# Patient Record
Sex: Female | Born: 1960
Health system: Southern US, Community
[De-identification: ages and names within clinical notes are randomized; demographics above are authoritative.]

## PROBLEM LIST (undated history)

## (undated) DIAGNOSIS — D497 Neoplasm of unspecified behavior of endocrine glands and other parts of nervous system: Secondary | ICD-10-CM

## (undated) DIAGNOSIS — R112 Nausea with vomiting, unspecified: Secondary | ICD-10-CM

## (undated) DIAGNOSIS — D219 Benign neoplasm of connective and other soft tissue, unspecified: Secondary | ICD-10-CM

## (undated) DIAGNOSIS — Z9889 Other specified postprocedural states: Secondary | ICD-10-CM

## (undated) DIAGNOSIS — B019 Varicella without complication: Secondary | ICD-10-CM

## (undated) HISTORY — DX: Varicella without complication: B01.9

## (undated) HISTORY — DX: Benign neoplasm of connective and other soft tissue, unspecified: D21.9

## (undated) HISTORY — PX: TONSILLECTOMY: SUR1361

## (undated) HISTORY — PX: AUGMENTATION MAMMAPLASTY: SUR837

## (undated) HISTORY — PX: TOTAL VAGINAL HYSTERECTOMY: SHX2548

## (undated) HISTORY — PX: BREAST ENHANCEMENT SURGERY: SHX7

## (undated) HISTORY — PX: FOOT SURGERY: SHX648

## (undated) HISTORY — PX: ABLATION: SHX5711

## (undated) HISTORY — PX: ABDOMINAL HYSTERECTOMY: SHX81

## (undated) HISTORY — PX: BREAST SURGERY: SHX581

---

## 2015-09-17 DIAGNOSIS — H6063 Unspecified chronic otitis externa, bilateral: Secondary | ICD-10-CM | POA: Insufficient documentation

## 2017-03-28 DIAGNOSIS — Z713 Dietary counseling and surveillance: Secondary | ICD-10-CM | POA: Diagnosis not present

## 2017-04-10 DIAGNOSIS — Z713 Dietary counseling and surveillance: Secondary | ICD-10-CM | POA: Diagnosis not present

## 2017-04-24 DIAGNOSIS — Z713 Dietary counseling and surveillance: Secondary | ICD-10-CM | POA: Diagnosis not present

## 2017-05-08 DIAGNOSIS — Z713 Dietary counseling and surveillance: Secondary | ICD-10-CM | POA: Diagnosis not present

## 2017-05-22 DIAGNOSIS — Z713 Dietary counseling and surveillance: Secondary | ICD-10-CM | POA: Diagnosis not present

## 2017-06-05 DIAGNOSIS — Z713 Dietary counseling and surveillance: Secondary | ICD-10-CM | POA: Diagnosis not present

## 2017-06-18 ENCOUNTER — Ambulatory Visit (INDEPENDENT_AMBULATORY_CARE_PROVIDER_SITE_OTHER): Payer: BLUE CROSS/BLUE SHIELD | Admitting: Internal Medicine

## 2017-06-18 VITALS — BP 106/60 | HR 60 | Temp 98.1°F | Ht 66.0 in | Wt 164.2 lb

## 2017-06-18 DIAGNOSIS — Z0000 Encounter for general adult medical examination without abnormal findings: Secondary | ICD-10-CM | POA: Diagnosis not present

## 2017-06-18 DIAGNOSIS — Z1159 Encounter for screening for other viral diseases: Secondary | ICD-10-CM

## 2017-06-18 DIAGNOSIS — Z1389 Encounter for screening for other disorder: Secondary | ICD-10-CM

## 2017-06-18 DIAGNOSIS — Z13220 Encounter for screening for lipoid disorders: Secondary | ICD-10-CM | POA: Diagnosis not present

## 2017-06-18 DIAGNOSIS — L739 Follicular disorder, unspecified: Secondary | ICD-10-CM | POA: Diagnosis not present

## 2017-06-18 DIAGNOSIS — D1779 Benign lipomatous neoplasm of other sites: Secondary | ICD-10-CM

## 2017-06-18 DIAGNOSIS — H01119 Allergic dermatitis of unspecified eye, unspecified eyelid: Secondary | ICD-10-CM

## 2017-06-18 DIAGNOSIS — L219 Seborrheic dermatitis, unspecified: Secondary | ICD-10-CM

## 2017-06-18 DIAGNOSIS — E559 Vitamin D deficiency, unspecified: Secondary | ICD-10-CM | POA: Diagnosis not present

## 2017-06-18 DIAGNOSIS — R200 Anesthesia of skin: Secondary | ICD-10-CM

## 2017-06-18 DIAGNOSIS — D1720 Benign lipomatous neoplasm of skin and subcutaneous tissue of unspecified limb: Secondary | ICD-10-CM

## 2017-06-18 DIAGNOSIS — E049 Nontoxic goiter, unspecified: Secondary | ICD-10-CM | POA: Diagnosis not present

## 2017-06-18 DIAGNOSIS — R0982 Postnasal drip: Secondary | ICD-10-CM | POA: Diagnosis not present

## 2017-06-18 DIAGNOSIS — R208 Other disturbances of skin sensation: Secondary | ICD-10-CM | POA: Diagnosis not present

## 2017-06-18 MED ORDER — FLUOCINOLONE ACETONIDE 0.01 % EX SOLN: Freq: Two times a day (BID) | CUTANEOUS | 0 refills | Status: DC

## 2017-06-18 MED ORDER — HYDROCORTISONE BUTYR LIPO BASE 0.1 % EX CREA: 1.0000 | TOPICAL_CREAM | Freq: Two times a day (BID) | CUTANEOUS | 0 refills | Status: DC

## 2017-06-18 MED ORDER — DOXYCYCLINE HYCLATE 100 MG PO TABS: 100.0000 mg | ORAL_TABLET | Freq: Two times a day (BID) | ORAL | 0 refills | Status: DC

## 2017-06-18 NOTE — Progress Notes (Signed)
Pre visit review using our clinic review tool, if applicable. No additional management support is needed unless otherwise documented below in the visit note. 

## 2017-06-18 NOTE — Patient Instructions (Addendum)
Please add Nasal Saline then Flonase  Try Claritin or Zyrtec at night  Try Nizoral Shampoo over the counter  We will sch thyroid US and right upper extremity ultrasound  Use locoid to face  Use Fluocinolone to ears with nizoral shampoo  Labs in 1-2 weeks, physical in 1 month  Atopic Dermatitis Atopic dermatitis is a skin disorder that causes inflammation of the skin. This is the most common type of eczema. Eczema is a group of skin conditions that cause the skin to be itchy, red, and swollen. This condition is generally worse during the cooler winter months and often improves during the warm summer months. Symptoms can vary from person to person. Atopic dermatitis usually starts showing signs in infancy and can last through adulthood. This condition cannot be passed from one person to another (non-contagious), but it is more common in families. Atopic dermatitis may not always be present. When it is present, it is called a flare-up. What are the causes? The exact cause of this condition is not known. Flare-ups of the condition may be triggered by:  Contact with something that you are sensitive or allergic to.  Stress.  Certain foods.  Extremely hot or cold weather.  Harsh chemicals and soaps.  Dry air.  Chlorine.  What increases the risk? This condition is more likely to develop in people who have a personal history or family history of eczema, allergies, asthma, or hay fever. What are the signs or symptoms? Symptoms of this condition include:  Dry, scaly skin.  Red, itchy rash.  Itchiness, which can be severe. This may occur before the skin rash. This can make sleeping difficult.  Skin thickening and cracking that can occur over time.  How is this diagnosed? This condition is diagnosed based on your symptoms, a medical history, and a physical exam. How is this treated? There is no cure for this condition, but symptoms can usually be controlled. Treatment focuses  on:  Controlling the itchiness and scratching. You may be given medicines, such as antihistamines or steroid creams.  Limiting exposure to things that you are sensitive or allergic to (allergens).  Recognizing situations that cause stress and developing a plan to manage stress.  If your atopic dermatitis does not get better with medicines, or if it is all over your body (widespread), a treatment using a specific type of light (phototherapy) may be used. Follow these instructions at home: Skin care  Keep your skin well-moisturized. Doing this seals in moisture and helps to prevent dryness. ? Use unscented lotions that have petroleum in them. ? Avoid lotions that contain alcohol or water. They can dry the skin.  Keep baths or showers short (less than 5 minutes) in warm water. Do not use hot water. ? Use mild, unscented cleansers for bathing. Avoid soap and bubble bath. ? Apply a moisturizer to your skin right after a bath or shower.  Do not apply anything to your skin without checking with your health care provider. General instructions  Dress in clothes made of cotton or cotton blends. Dress lightly because heat increases itchiness.  When washing your clothes, rinse your clothes twice so all of the soap is removed.  Avoid any triggers that can cause a flare-up.  Try to manage your stress.  Keep your fingernails cut short.  Avoid scratching. Scratching makes the rash and itchiness worse. It may also result in a skin infection (impetigo) due to a break in the skin caused by scratching.  Take or apply  over-the-counter and prescription medicines only as told by your health care provider.  Keep all follow-up visits as told by your health care provider. This is important.  Do not be around people who have cold sores or fever blisters. If you get the infection, it may cause your atopic dermatitis to worsen. Contact a health care provider if:  Your itchiness interferes with  sleep.  Your rash gets worse or it is not better within one week of starting treatment.  You have a fever.  You have a rash flare-up after having contact with someone who has cold sores or fever blisters. Get help right away if:  You develop pus or soft yellow scabs in the rash area. Summary  This condition causes a red rash and itchy, dry, scaly skin.  Treatment focuses on controlling the itchiness and scratching, limiting exposure to things that you are sensitive or allergic to (allergens), recognizing situations that cause stress, and developing a plan to manage stress.  Keep your skin well-moisturized.  Keep baths or showers shorter than 5 minutes and use warm water. Do not use hot water. This information is not intended to replace advice given to you by your health care provider. Make sure you discuss any questions you have with your health care provider. Document Released: 03/02/2000 Document Revised: 04/06/2016 Document Reviewed: 04/06/2016 Elsevier Interactive Patient Education  2018 [place] A goiter is an enlarged thyroid gland. The thyroid gland is located in the lower front of the neck. The gland produces hormones that regulate mood, body temperature, pulse rate, and digestion. Most goiters are painless and are not a cause for serious concern. Goiters and conditions that cause goiters can be treated, if necessary. What are the causes? Causes of this condition include:  Diseases that attack healthy cells in your body (autoimmune diseases) and affect your thyroid function, such as: ? Graves disease. This causes too much thyroid hormone to be produced and it makes your thyroid overly active (hyperthyroidism). ? Hashimoto disease. This type of inflammation of the thyroid (thyroiditis) causes too little thyroid hormone to be produced and it makes your thyroid not active enough (hypothyroidism).  Other conditions that cause thyroiditis.  Nodular goiter. This  means that there are one or more small growths on your thyroid. These can create too much thyroid hormone.  Pregnancy.  Thyroid cancer. This is rare.  Certain medicines.  Radiation exposure.  Iodine deficiency.  In some cases, the cause may not be known (idiopathic). What increases the risk? This condition is more likely to develop in:  People who have a family history of goiter.  Women.  People who do not get enough iodine in their diet.  People who are older than 79.  People who smoke tobacco.  What are the signs or symptoms? Common symptoms of this condition include:  Swelling in the lower part of the neck. This swelling can range from a very small bump to a large lump.  A tight feeling in the throat.  A hoarse voice.  Other symptoms include:  Coughing.  Wheezing.  Difficulty swallowing.  Difficulty breathing.  Bulging neck veins.  Dizziness.  In some cases, there are no symptoms and thyroid hormone levels may be normal. When a goiter is the result of hyperthyroidism, symptoms may also include:  Nervousness or restlessness.  Inability to tolerate heat.  Unexplained weight loss.  Diarrhea.  Change in the texture of hair or skin.  Changes in heart beat, such as skipped beats,  extra beats, or a rapid heart rate.  Loss of menstruation.  Shaky hands.  Increased appetite.  Sleep problems.  When a goiter is the result of hypothyroidism, symptoms may also include:  Feeling like you have no energy (lethargy).  Inability to tolerate cold.  Weight gain that is not explained by a change in diet or exercise habits.  Dry skin.  Coarse hair.  Menstrual irregularity.  Constipation.  Sadness or depression.  How is this diagnosed? This condition may be diagnosed with a medical history and physical exam. You may also have other tests, including:  Blood tests to check thyroid function.  Imaging tests, such as: ? Ultrasonography. ? CT  scan. ? MRI. ? Thyroid scan. You will be given a safe radioactive injection, then images will be taken of your thyroid.  Tissue sample (biopsy) of the goiter or any nodules. This checks to see if the goiter or nodules are cancerous.  How is this treated? Treatment for this condition depends on the cause. Treatment may include:  Medicines to control your thyroid.  Anti-inflammatory or steroid medicines, if inflammation is the cause.  Iodine supplements or changes in diet, if the goiter is caused by iodine deficiency.  Radiation therapy.  Surgery to remove your thyroid.  In some cases, no treatment is necessary, and your health care provider will monitor your condition at regular checkups. Follow these instructions at home:  Follow recommendations from your health care provider for any changes to your diet.  Take over-the-counter and prescription medicines only as told by your health care provider.  Do not use any tobacco products, including cigarettes, chewing tobacco, or e-cigarettes. If you need help quitting, ask your health care provider.  Keep all follow-up appointments as told by your health care provider. This is important. Contact a health care provider if:  Your symptoms do not get better with treatment. Get help right away if:  You develop sudden, unexplained confusion or other mental changes.  You have nausea, vomiting, or diarrhea.  You develop a fever.  Your skin or the whites of your eyes appear yellow (jaundice).  You develop chest pain.  You have trouble breathing or swallowing.  You suddenly become very weak.  You experience extreme restlessness. This information is not intended to replace advice given to you by your health care provider. Make sure you discuss any questions you have with your health care provider. Document Released: 08/23/2009 Document Revised: 09/23/2015 Document Reviewed: 03/01/2014 Elsevier Interactive Patient Education  2018  [place].  Lipoma A lipoma is a noncancerous (benign) tumor that is made up of fat cells. This is a very common type of soft-tissue growth. Lipomas are usually found under the skin (subcutaneous). They may occur in any tissue of the body that contains fat. Common areas for lipomas to appear include the back, shoulders, buttocks, and thighs. Lipomas grow slowly, and they are usually painless. Most lipomas do not cause problems and do not require treatment. What are the causes? The cause of this condition is not known. What increases the risk? This condition is more likely to develop in:  People who are 79-58 years old.  People who have a family history of lipomas.  What are the signs or symptoms? A lipoma usually appears as a small, round bump under the skin. It may feel soft or rubbery, but the firmness can vary. Most lipomas are not painful. However, a lipoma may become painful if it is located in an area where it pushes on  nerves. How is this diagnosed? A lipoma can usually be diagnosed with a physical exam. You may also have tests to confirm the diagnosis and to rule out other conditions. Tests may include:  Imaging tests, such as a CT scan or MRI.  Removal of a tissue sample to be looked at under a microscope (biopsy).  How is this treated? Treatment is not needed for small lipomas that are not causing problems. If a lipoma continues to get bigger or it causes problems, removal is often the best option. Lipomas can also be removed to improve appearance. Removal of a lipoma is usually done with a surgery in which the fatty cells and the surrounding capsule are removed. Most often, a medicine that numbs the area (local anesthetic) is used for this procedure. Follow these instructions at home:  Keep all follow-up visits as directed by your health care provider. This is important. Contact a health care provider if:  Your lipoma becomes larger or hard.  Your lipoma becomes painful,  red, or increasingly swollen. These could be signs of infection or a more serious condition. This information is not intended to replace advice given to you by your health care provider. Make sure you discuss any questions you have with your health care provider. Document Released: 02/23/2002 Document Revised: 08/11/2015 Document Reviewed: 03/01/2014 Elsevier Interactive Patient Education  2018 [employer].   Allergic Rhinitis, Adult Allergic rhinitis is an allergic reaction that affects the mucous membrane inside the nose. It causes sneezing, a runny or stuffy nose, and the feeling of mucus going down the back of the throat (postnasal drip). Allergic rhinitis can be mild to severe. There are two types of allergic rhinitis:  Seasonal. This type is also called hay fever. It happens only during certain seasons.  Perennial. This type can happen at any time of the year.  What are the causes? This condition happens when the body's defense system (immune system) responds to certain harmless substances called allergens as though they were germs.  Seasonal allergic rhinitis is triggered by pollen, which can come from grasses, trees, and weeds. Perennial allergic rhinitis may be caused by:  House dust mites.  Pet dander.  Mold spores.  What are the signs or symptoms? Symptoms of this condition include:  Sneezing.  Runny or stuffy nose (nasal congestion).  Postnasal drip.  Itchy nose.  Tearing of the eyes.  Trouble sleeping.  Daytime sleepiness.  How is this diagnosed? This condition may be diagnosed based on:  Your medical history.  A physical exam.  Tests to check for related conditions, such as: ? Asthma. ? Pink eye. ? Ear infection. ? Upper respiratory infection.  Tests to find out which allergens trigger your symptoms. These may include skin or blood tests.  How is this treated? There is no cure for this condition, but treatment can help control symptoms.  Treatment may include:  Taking medicines that block allergy symptoms, such as antihistamines. Medicine may be given as a shot, nasal spray, or pill.  Avoiding the allergen.  Desensitization. This treatment involves getting ongoing shots until your body becomes less sensitive to the allergen. This treatment may be done if other treatments do not help.  If taking medicine and avoiding the allergen does not work, new, stronger medicines may be prescribed.  Follow these instructions at home:  Find out what you are allergic to. Common allergens include smoke, dust, and pollen.  Avoid the things you are allergic to. These are some things you can  do to help avoid allergens: ? Replace carpet with wood, tile, or vinyl flooring. Carpet can trap dander and dust. ? Do not smoke. Do not allow smoking in your home. ? Change your heating and air conditioning filter at least once a month. ? During allergy season:  Keep windows closed as much as possible.  Plan outdoor activities when pollen counts are lowest. This is usually during the evening hours.  When coming indoors, change clothing and shower before sitting on furniture or bedding.  Take over-the-counter and prescription medicines only as told by your health care provider.  Keep all follow-up visits as told by your health care provider. This is important. Contact a health care provider if:  You have a fever.  You develop a persistent cough.  You make whistling sounds when you breathe (you wheeze).  Your symptoms interfere with your normal daily activities. Get help right away if:  You have shortness of breath. Summary  This condition can be managed by taking medicines as directed and avoiding allergens.  Contact your health care provider if you develop a persistent cough or fever.  During allergy season, keep windows closed as much as possible. This information is not intended to replace advice given to you by your health care  provider. Make sure you discuss any questions you have with your health care provider. Document Released: 11/28/2000 Document Revised: 04/12/2016 Document Reviewed: 04/12/2016 Elsevier Interactive Patient Education  [employer].

## 2017-06-19 DIAGNOSIS — Z713 Dietary counseling and surveillance: Secondary | ICD-10-CM | POA: Diagnosis not present

## 2017-06-20 ENCOUNTER — Encounter: Payer: Self-pay | Admitting: Internal Medicine

## 2017-06-20 DIAGNOSIS — D1720 Benign lipomatous neoplasm of skin and subcutaneous tissue of unspecified limb: Secondary | ICD-10-CM | POA: Insufficient documentation

## 2017-06-20 DIAGNOSIS — L219 Seborrheic dermatitis, unspecified: Secondary | ICD-10-CM | POA: Insufficient documentation

## 2017-06-20 DIAGNOSIS — E049 Nontoxic goiter, unspecified: Secondary | ICD-10-CM | POA: Insufficient documentation

## 2017-06-20 DIAGNOSIS — R200 Anesthesia of skin: Secondary | ICD-10-CM | POA: Insufficient documentation

## 2017-06-20 DIAGNOSIS — R208 Other disturbances of skin sensation: Secondary | ICD-10-CM | POA: Insufficient documentation

## 2017-06-20 DIAGNOSIS — H01119 Allergic dermatitis of unspecified eye, unspecified eyelid: Secondary | ICD-10-CM | POA: Insufficient documentation

## 2017-06-20 DIAGNOSIS — R0982 Postnasal drip: Secondary | ICD-10-CM | POA: Insufficient documentation

## 2017-06-20 NOTE — Progress Notes (Signed)
Chief Complaint  Patient presents with  . Establish Care   New patient moved from [place]. Mother is pt here [place] and she lives with mother. She wanted physical today but has multiple complaints  1. C/o left lower back lesion noticed yesterday painful w/o drainage  2. C/o enlargement to thyroid x 2 months w/o pain  3. C/o right upper inner arm lesion painful to touch and also lesion on left forearm 4. C/o irritation around left eyelid tried neomycine, olive oil, coconut oil w/o relief eyelids itchy/flack 5. C/o itching and flaking to b/l ears  6. S/p left foot surgery partial nerve removed 2017 and having numbness in foot now esp in middle toe feels like she is stepping on nails. She wants to hold on podiatry referral for now  7. C/o PND x 1 month with clear mucus no sinus pressure/pain also c/o runny nose   Review of Systems  Constitutional: Negative for weight loss.  HENT: Negative for hearing loss.        +postnasal  +enlarged thyroid   Eyes: Negative for blurred vision.  Respiratory: Negative for shortness of breath.   Cardiovascular: Negative for chest pain.  Gastrointestinal: Negative for abdominal pain.  Musculoskeletal: Negative for falls.  Skin: Positive for itching and rash.       +skin lesion  Neurological: Positive for sensory change.  Endo/Heme/Allergies: Positive for environmental allergies.  Psychiatric/Behavioral: Negative for depression.   History reviewed. No pertinent past medical history. Past Surgical History:  Procedure Laterality Date  . ABDOMINAL HYSTERECTOMY     fibroids   . ABLATION    . BREAST ENHANCEMENT SURGERY     2010  . BREAST SURGERY     breast bx 2015 benign breasts cysts   . FOOT SURGERY     left removed partial nerve   . TONSILLECTOMY     Family History  Problem Relation Age of Onset  . Diabetes Mother   . Heart disease Mother   . Depression Mother   . Alcohol abuse Father   . Cancer Father        mesthelioma house with  asbestos    Social History   Socioeconomic History  . Marital status: Divorced    Spouse name: Not on file  . Number of children: Not on file  . Years of education: Not on file  . Highest education level: Not on file  Occupational History  . Not on file  Social Needs  . Financial resource strain: Not on file  . Food insecurity:    Worry: Not on file    Inability: Not on file  . Transportation needs:    Medical: Not on file    Non-medical: Not on file  Tobacco Use  . Smoking status: Former Research scientist (life sciences)  . Smokeless tobacco: Never Used  . Tobacco comment: smoked 13 years ago 1/2 ppd   Substance and Sexual Activity  . Alcohol use: Yes  . Drug use: Not Currently  . Sexual activity: Not Currently  Lifestyle  . Physical activity:    Days per week: Not on file    Minutes per session: Not on file  . Stress: Not on file  Relationships  . Social connections:    Talks on phone: Not on file    Gets together: Not on file    Attends religious service: Not on file    Active member of club or organization: Not on file    Attends meetings of clubs or organizations: Not  on file    Relationship status: Not on file  . Intimate partner violence:    Fear of current or ex partner: Not on file    Emotionally abused: Not on file    Physically abused: Not on file    Forced sexual activity: Not on file  Other Topics Concern  . Not on file  Social History Narrative   Safe in relationship    Owns guns    Wears Biomedical engineer    2 kids    Current Meds  Medication Sig  . Biotin 1000 MCG tablet Take 1,000 mcg by mouth 3 (three) times daily.  . fluticasone (FLONASE) 50 MCG/ACT nasal spray Place into the nose.  . Multiple Vitamins-Minerals (CENTRUM SILVER 50+WOMEN) TABS Take by mouth.   No Known Allergies No results found for this or any previous visit (from the past 2160 hour(s)). Objective  Body mass index is 26.5 kg/m. Wt Readings from Last 3 Encounters:  06/18/17  164 lb 3.2 oz (74.5 kg)   Temp Readings from Last 3 Encounters:  06/18/17 98.1 F (36.7 C) (Oral)   BP Readings from Last 3 Encounters:  06/18/17 106/60   Pulse Readings from Last 3 Encounters:  06/18/17 60    Physical Exam  Constitutional: She is oriented to person, place, and time and well-developed, well-nourished, and in no distress. Vital signs are normal.  HENT:  Head: Normocephalic and atraumatic.    Mouth/Throat: Oropharynx is clear and moist and mucous membranes are normal.  Scale to b/l ears ? seb derm  Eyes: Pupils are equal, round, and reactive to light. Conjunctivae are normal.  Neck: Thyromegaly present.    L>R enlarged thyroid   Cardiovascular: Normal rate, regular rhythm and normal heart sounds.  Pulmonary/Chest: Effort normal and breath sounds normal.  Abdominal: Soft. Bowel sounds are normal. There is no tenderness.  Neurological: She is alert and oriented to person, place, and time. Gait normal. Gait normal.  Skin: Skin is warm and dry.     0.5-1 cm lesion left lower back likely folliculitis  +eyelid dermatitis L>R   Psychiatric: Mood, memory, affect and judgment normal.  Nursing note and vitals reviewed.   Assessment   1. Folliculitis  2. seb derm to ears  3. Eyelid dermatitis  4. Thyroid goiter  5. ?lipomas right medial upper arm and left lower forearm  6. S/p left foot surgery with numbness and abnormal sensation middle toes  7. Postnasal drip  8. HM Plan   1. Doxy bid x 1 week  2. Steroid solution, nizoral shampoo  3. licoid lipocream eyelids  4. Thyroid US  5. [place] RUE soft tissue  6. Consider podiatry in future  7. Trial of OTC AH, Flonase, NS 8.  Did not have flu shot this year  Tdap had 06/2014 Given Rx shingrix    Pap had 2018 Dr. Perlie Mayo MD [facility] need to get records. LMP 2008, s/p hysterectomy 2008 fibroids Dr. Grandville Silos no h/o abnormal pap  mammo last 05/2016 s/p breast implants in 2015 [place]  Imaging [place] MD Colonoscopy never had hospitalized 2010 after presyncope, dehydration from Miralax/Dulcolax pref  -disc cologaurd pt to call and check on price.  Last eye exam 2018 [facility]  Saw dermatology [place] MD normal moles 2018  Former smoker quit 1988 smoked x 13 years 1/2 ppd Hep C neg 04/16/15     Will do physical at f/u  Provider: Dr. Olivia Mackie McLean-Scocuzza-[facility]

## 2017-06-25 ENCOUNTER — Ambulatory Visit
Admission: RE | Admit: 2017-06-25 | Discharge: 2017-06-25 | Disposition: A | Discharge status: Home / Self Care | Payer: BLUE CROSS/BLUE SHIELD | Source: Ambulatory Visit | Attending: Internal Medicine | Admitting: Internal Medicine

## 2017-06-25 DIAGNOSIS — M799 Soft tissue disorder, unspecified: Secondary | ICD-10-CM | POA: Insufficient documentation

## 2017-06-25 DIAGNOSIS — D1779 Benign lipomatous neoplasm of other sites: Secondary | ICD-10-CM | POA: Insufficient documentation

## 2017-06-25 DIAGNOSIS — D1720 Benign lipomatous neoplasm of skin and subcutaneous tissue of unspecified limb: Secondary | ICD-10-CM

## 2017-06-25 DIAGNOSIS — E041 Nontoxic single thyroid nodule: Secondary | ICD-10-CM | POA: Diagnosis not present

## 2017-06-25 DIAGNOSIS — E049 Nontoxic goiter, unspecified: Secondary | ICD-10-CM

## 2017-06-25 DIAGNOSIS — M7989 Other specified soft tissue disorders: Secondary | ICD-10-CM | POA: Diagnosis not present

## 2017-06-27 ENCOUNTER — Telehealth: Payer: Self-pay | Admitting: Internal Medicine

## 2017-06-27 NOTE — Telephone Encounter (Signed)
FYI

## 2017-06-27 NOTE — Telephone Encounter (Signed)
-----   Message from Pernell Dupre, RN sent at 06/27/2017 11:10 AM EDT ----- Pt called was given result notes for thyroid US Pt  Agreeable for endocrine appointment Tested Patients  Telephone [phone]  And it worked on  One  Occasion  And  On another  Occasion  It  Was  Unavailable  - Pt  Advised and  Will notify her  [place]

## 2017-06-28 ENCOUNTER — Telehealth: Payer: Self-pay | Admitting: Internal Medicine

## 2017-06-28 NOTE — Telephone Encounter (Signed)
Copied from [place] [id number]. Topic: Appointment Scheduling - Scheduling Inquiry for Clinic >> Jun 28, 2017  1:29 PM Boyd Kerbs wrote: Reason for CRM:   pt. Is asking regarding the scan referral if can make appt.  For both imaging the same day and time, preferably late afternoon.  The earliest she can do appt would  The 18th of April.

## 2017-07-01 NOTE — Telephone Encounter (Signed)
No referral or orders for images. Please advise.

## 2017-07-03 ENCOUNTER — Other Ambulatory Visit: Payer: Self-pay | Admitting: Internal Medicine

## 2017-07-03 DIAGNOSIS — E041 Nontoxic single thyroid nodule: Secondary | ICD-10-CM

## 2017-07-03 DIAGNOSIS — L989 Disorder of the skin and subcutaneous tissue, unspecified: Secondary | ICD-10-CM

## 2017-07-03 NOTE — Telephone Encounter (Signed)
Orders in   [place]

## 2017-07-03 NOTE — Telephone Encounter (Signed)
Please sch [facility] and MRI right upper ext with and w/o contrast   [place]

## 2017-07-11 ENCOUNTER — Telehealth: Payer: Self-pay

## 2017-07-11 NOTE — Telephone Encounter (Signed)
Please resch

## 2017-07-11 NOTE — Telephone Encounter (Signed)
FYI

## 2017-07-11 NOTE — Telephone Encounter (Signed)
Copied from [place] [phone]. Topic: Referral - Request >> Jul 11, 2017  1:34 PM Lennox Solders wrote: Reason for CRM:pt would like to go to [facility] for mri humerus right . Pt cancelled mri at [facility] due to cost.  [facility] phone [phone] and fax number [phone]

## 2017-07-12 ENCOUNTER — Ambulatory Visit: Admission: RE | Admit: 2017-07-12 | Payer: BLUE CROSS/BLUE SHIELD | Source: Ambulatory Visit

## 2017-07-12 NOTE — Telephone Encounter (Signed)
Pt states she does not mind scheduling her own appt for this if she just gets the information and when she can call.

## 2017-07-18 ENCOUNTER — Other Ambulatory Visit (INDEPENDENT_AMBULATORY_CARE_PROVIDER_SITE_OTHER): Payer: BLUE CROSS/BLUE SHIELD

## 2017-07-18 DIAGNOSIS — Z13220 Encounter for screening for lipoid disorders: Secondary | ICD-10-CM | POA: Diagnosis not present

## 2017-07-18 DIAGNOSIS — Z0000 Encounter for general adult medical examination without abnormal findings: Secondary | ICD-10-CM | POA: Diagnosis not present

## 2017-07-18 DIAGNOSIS — Z1389 Encounter for screening for other disorder: Secondary | ICD-10-CM | POA: Diagnosis not present

## 2017-07-18 DIAGNOSIS — Z1159 Encounter for screening for other viral diseases: Secondary | ICD-10-CM | POA: Diagnosis not present

## 2017-07-18 DIAGNOSIS — E049 Nontoxic goiter, unspecified: Secondary | ICD-10-CM

## 2017-07-18 DIAGNOSIS — E559 Vitamin D deficiency, unspecified: Secondary | ICD-10-CM

## 2017-07-18 LAB — CBC WITH DIFFERENTIAL/PLATELET
BASOS ABS: 0.1 10*3/uL (ref 0.0–0.1)
Basophils Relative: 1.3 % (ref 0.0–3.0)
Eosinophils Absolute: 0.2 10*3/uL (ref 0.0–0.7)
Eosinophils Relative: 3.4 % (ref 0.0–5.0)
HCT: 42.2 % (ref 36.0–46.0)
Hemoglobin: 14.6 g/dL (ref 12.0–15.0)
LYMPHS ABS: 1.9 10*3/uL (ref 0.7–4.0)
Lymphocytes Relative: 39 % (ref 12.0–46.0)
MCHC: 34.6 g/dL (ref 30.0–36.0)
MCV: 93.4 fl (ref 78.0–100.0)
MONOS PCT: 12.8 % — AB (ref 3.0–12.0)
Monocytes Absolute: 0.6 10*3/uL (ref 0.1–1.0)
NEUTROS PCT: 43.5 % (ref 43.0–77.0)
Neutro Abs: 2.1 10*3/uL (ref 1.4–7.7)
Platelets: 301 10*3/uL (ref 150.0–400.0)
RBC: 4.52 Mil/uL (ref 3.87–5.11)
RDW: 12.3 % (ref 11.5–15.5)
WBC: 4.8 10*3/uL (ref 4.0–10.5)

## 2017-07-18 LAB — URINALYSIS, ROUTINE W REFLEX MICROSCOPIC
BILIRUBIN URINE: NEGATIVE
HGB URINE DIPSTICK: NEGATIVE
KETONES UR: NEGATIVE
LEUKOCYTES UA: NEGATIVE
NITRITE: NEGATIVE
RBC / HPF: NONE SEEN (ref 0–?)
Specific Gravity, Urine: 1.015 (ref 1.000–1.030)
Total Protein, Urine: NEGATIVE
URINE GLUCOSE: NEGATIVE
UROBILINOGEN UA: 0.2 (ref 0.0–1.0)
WBC UA: NONE SEEN (ref 0–?)
pH: 6.5 (ref 5.0–8.0)

## 2017-07-18 LAB — T4, FREE: Free T4: 0.83 ng/dL (ref 0.60–1.60)

## 2017-07-18 LAB — COMPREHENSIVE METABOLIC PANEL
ALK PHOS: 80 U/L (ref 39–117)
ALT: 19 U/L (ref 0–35)
AST: 24 U/L (ref 0–37)
Albumin: 4.2 g/dL (ref 3.5–5.2)
BILIRUBIN TOTAL: 0.8 mg/dL (ref 0.2–1.2)
BUN: 17 mg/dL (ref 6–23)
CO2: 28 mEq/L (ref 19–32)
Calcium: 9.2 mg/dL (ref 8.4–10.5)
Chloride: 106 mEq/L (ref 96–112)
Creatinine, Ser: 0.83 mg/dL (ref 0.40–1.20)
GFR: 74.87 mL/min (ref >60.00)
GLUCOSE: 93 mg/dL (ref 70–99)
Potassium: 4.2 mEq/L (ref 3.5–5.1)
SODIUM: 142 meq/L (ref 135–145)
TOTAL PROTEIN: 6.7 g/dL (ref 6.0–8.3)

## 2017-07-18 LAB — LIPID PANEL
CHOL/HDL RATIO: 3
Cholesterol: 177 mg/dL (ref 0–200)
HDL: 67.7 mg/dL (ref >39.00)
LDL Cholesterol: 96 mg/dL (ref 0–99)
NONHDL: 109.71
Triglycerides: 71 mg/dL (ref 0.0–149.0)
VLDL: 14.2 mg/dL (ref 0.0–40.0)

## 2017-07-18 LAB — VITAMIN D 25 HYDROXY (VIT D DEFICIENCY, FRACTURES): VITD: 29.56 ng/mL — ABNORMAL LOW (ref 30.00–100.00)

## 2017-07-18 LAB — TSH: TSH: 2.03 u[IU]/mL (ref 0.35–4.50)

## 2017-07-18 LAB — T3, FREE: T3, Free: 3.5 pg/mL (ref 2.3–4.2)

## 2017-07-19 LAB — HEPATITIS B SURFACE ANTIBODY, QUANTITATIVE: Hepatitis B-Post: <5 m[IU]/mL — ABNORMAL LOW (ref >=10)

## 2017-07-24 ENCOUNTER — Telehealth: Payer: Self-pay

## 2017-07-24 NOTE — Telephone Encounter (Signed)
Telephone call was an old message about lab results.

## 2017-07-24 NOTE — Telephone Encounter (Signed)
Copied from [place] [phone]. Topic: Quick Communication - Office Called Patient >> Jul 24, 2017 11:01 AM Ivar Drape wrote: Reason for CRM:   Patient stated she received a telephone call from [facility]

## 2017-08-01 ENCOUNTER — Encounter: Payer: Self-pay | Admitting: Internal Medicine

## 2017-08-01 ENCOUNTER — Ambulatory Visit (INDEPENDENT_AMBULATORY_CARE_PROVIDER_SITE_OTHER): Payer: BLUE CROSS/BLUE SHIELD | Admitting: Internal Medicine

## 2017-08-01 VITALS — BP 110/76 | HR 67 | Temp 98.2°F | Ht 69.0 in | Wt 163.0 lb

## 2017-08-01 DIAGNOSIS — E559 Vitamin D deficiency, unspecified: Secondary | ICD-10-CM | POA: Diagnosis not present

## 2017-08-01 DIAGNOSIS — Z0000 Encounter for general adult medical examination without abnormal findings: Secondary | ICD-10-CM

## 2017-08-01 DIAGNOSIS — D1720 Benign lipomatous neoplasm of skin and subcutaneous tissue of unspecified limb: Secondary | ICD-10-CM | POA: Diagnosis not present

## 2017-08-01 DIAGNOSIS — E041 Nontoxic single thyroid nodule: Secondary | ICD-10-CM | POA: Diagnosis not present

## 2017-08-01 DIAGNOSIS — L219 Seborrheic dermatitis, unspecified: Secondary | ICD-10-CM | POA: Diagnosis not present

## 2017-08-01 LAB — HEMOCCULT GUIAC POC 1CARD (OFFICE): Fecal Occult Blood, POC: NEGATIVE

## 2017-08-01 MED ORDER — FLUOCINOLONE ACETONIDE 0.01 % EX SOLN: Freq: Two times a day (BID) | CUTANEOUS | 0 refills | Status: DC

## 2017-08-01 NOTE — Progress Notes (Signed)
Chief Complaint  Patient presents with  . Annual Exam   Annual doing well  Reviewed labs and thyroid US +4.1 cm left thyroid lesion appt endocrine 09/24/17  Vit D def not taking any vitamin D as of yet  Pending appt with OB/GYN 08/23/17 pap and mammogram.   Review of Systems  Constitutional: Negative for weight loss.  HENT: Negative for hearing loss.   Eyes: Negative for blurred vision.  Respiratory: Negative for shortness of breath.   Cardiovascular: Negative for chest pain.  Gastrointestinal: Negative for abdominal pain and blood in stool.  Genitourinary: Negative for frequency.  Musculoskeletal: Positive for joint pain.       Left foot pain chronic   Skin: Negative for rash.  Neurological: Negative for headaches.  Psychiatric/Behavioral: Negative for depression.   Past Medical History:  Diagnosis Date  . Chicken pox    Past Surgical History:  Procedure Laterality Date  . ABDOMINAL HYSTERECTOMY     fibroids   . ABLATION    . BREAST ENHANCEMENT SURGERY     2010  . BREAST SURGERY     breast bx 2015 benign breasts cysts   . FOOT SURGERY     left removed partial nerve   . TONSILLECTOMY     Family History  Problem Relation Age of Onset  . Diabetes Mother   . Heart disease Mother   . Depression Mother   . Alcohol abuse Father   . Cancer Father        mesthelioma house with asbestos    Social History   Socioeconomic History  . Marital status: Divorced    Spouse name: Not on file  . Number of children: Not on file  . Years of education: Not on file  . Highest education level: Not on file  Occupational History  . Not on file  Social Needs  . Financial resource strain: Not on file  . Food insecurity:    Worry: Not on file    Inability: Not on file  . Transportation needs:    Medical: Not on file    Non-medical: Not on file  Tobacco Use  . Smoking status: Former Research scientist (life sciences)  . Smokeless tobacco: Never Used  . Tobacco comment: smoked 13 years ago 1/2 ppd     Substance and Sexual Activity  . Alcohol use: Yes  . Drug use: Not Currently  . Sexual activity: Not Currently  Lifestyle  . Physical activity:    Days per week: Not on file    Minutes per session: Not on file  . Stress: Not on file  Relationships  . Social connections:    Talks on phone: Not on file    Gets together: Not on file    Attends religious service: Not on file    Active member of club or organization: Not on file    Attends meetings of clubs or organizations: Not on file    Relationship status: Not on file  . Intimate partner violence:    Fear of current or ex partner: Not on file    Emotionally abused: Not on file    Physically abused: Not on file    Forced sexual activity: Not on file  Other Topics Concern  . Not on file  Social History Narrative   Safe in relationship    Owns guns    Wears Biomedical engineer    2 kids    Current Meds  Medication Sig  .  Biotin 1000 MCG tablet Take 1,000 mcg by mouth 3 (three) times daily.  . Multiple Vitamins-Minerals (CENTRUM SILVER 50+WOMEN) TABS Take by mouth.   No Known Allergies Recent Results (from the past 2160 hour(s))  T3, free     Status: None   Collection Time: 07/18/17  7:59 AM  Result Value Ref Range   T3, Free 3.5 2.3 - 4.2 pg/mL  Vitamin D (25 hydroxy)     Status: Abnormal   Collection Time: 07/18/17  7:59 AM  Result Value Ref Range   VITD 29.56 (L) 30.00 - 100.00 ng/mL  Hepatitis B surface antibody     Status: Abnormal   Collection Time: 07/18/17  7:59 AM  Result Value Ref Range   Hepatitis B-Post <5 (L) > OR = 10 mIU/mL    Comment: . Patient does not have immunity to hepatitis B virus. . For additional information, please refer to http://education.[facility].com/faq/FAQ105 (This link is being provided for informational/ educational purposes only).   Urinalysis, Routine w reflex microscopic     Status: None   Collection Time: 07/18/17  7:59 AM  Result Value Ref Range    Color, Urine YELLOW Yellow;Lt. Yellow   APPearance CLEAR Clear   Specific Gravity, Urine 1.015 1.000 - 1.030   pH 6.5 5.0 - 8.0   Total Protein, Urine NEGATIVE Negative   Urine Glucose NEGATIVE Negative   Ketones, ur NEGATIVE Negative   Bilirubin Urine NEGATIVE Negative   Hgb urine dipstick NEGATIVE Negative   Urobilinogen, UA 0.2 0.0 - 1.0   Leukocytes, UA NEGATIVE Negative   Nitrite NEGATIVE Negative   WBC, UA none seen 0-2/hpf   RBC / HPF none seen 0-2/hpf   Squamous Epithelial / LPF Rare(0-4/hpf) Rare(0-4/hpf)  TSH     Status: None   Collection Time: 07/18/17  7:59 AM  Result Value Ref Range   TSH 2.03 0.35 - 4.50 uIU/mL  T4, free     Status: None   Collection Time: 07/18/17  7:59 AM  Result Value Ref Range   Free T4 0.83 0.60 - 1.60 ng/dL    Comment: Specimens from patients who are undergoing biotin therapy and /or ingesting biotin supplements may contain high levels of biotin.  The higher biotin concentration in these specimens interferes with this Free T4 assay.  Specimens that contain high levels  of biotin may cause false high results for this Free T4 assay.  Please interpret results in light of the total clinical presentation of the patient.    Lipid panel     Status: None   Collection Time: 07/18/17  7:59 AM  Result Value Ref Range   Cholesterol 177 0 - 200 mg/dL    Comment: ATP III Classification       Desirable:  < 200 mg/dL               Borderline High:  200 - 239 mg/dL          High:  > = 240 mg/dL   Triglycerides 71.0 0.0 - 149.0 mg/dL    Comment: Normal:  <150 mg/dLBorderline High:  150 - 199 mg/dL   HDL 67.70 >39.00 mg/dL   VLDL 14.2 0.0 - 40.0 mg/dL   LDL Cholesterol 96 0 - 99 mg/dL   Total CHOL/HDL Ratio 3     Comment:                Men          Women1/2 Average Risk  3.4          3.3Average Risk          5.0          4.42X Average Risk          9.6          7.13X Average Risk          15.0          11.0                       NonHDL 109.71     Comment:  NOTE:  Non-HDL goal should be 30 mg/dL higher than patient's LDL goal (i.e. LDL goal of < 70 mg/dL, would have non-HDL goal of < 100 mg/dL)  CBC with Differential/Platelet     Status: Abnormal   Collection Time: 07/18/17  7:59 AM  Result Value Ref Range   WBC 4.8 4.0 - 10.5 K/uL   RBC 4.52 3.87 - 5.11 Mil/uL   Hemoglobin 14.6 12.0 - 15.0 g/dL   HCT 42.2 36.0 - 46.0 %   MCV 93.4 78.0 - 100.0 fl   MCHC 34.6 30.0 - 36.0 g/dL   RDW 12.3 11.5 - 15.5 %   Platelets 301.0 150.0 - 400.0 K/uL   Neutrophils Relative % 43.5 43.0 - 77.0 %   Lymphocytes Relative 39.0 12.0 - 46.0 %   Monocytes Relative 12.8 (H) 3.0 - 12.0 %   Eosinophils Relative 3.4 0.0 - 5.0 %   Basophils Relative 1.3 0.0 - 3.0 %   Neutro Abs 2.1 1.4 - 7.7 K/uL   Lymphs Abs 1.9 0.7 - 4.0 K/uL   Monocytes Absolute 0.6 0.1 - 1.0 K/uL   Eosinophils Absolute 0.2 0.0 - 0.7 K/uL   Basophils Absolute 0.1 0.0 - 0.1 K/uL  Comprehensive metabolic panel     Status: None   Collection Time: 07/18/17  7:59 AM  Result Value Ref Range   Sodium 142 135 - 145 mEq/L   Potassium 4.2 3.5 - 5.1 mEq/L   Chloride 106 96 - 112 mEq/L   CO2 28 19 - 32 mEq/L   Glucose, Bld 93 70 - 99 mg/dL   BUN 17 6 - 23 mg/dL   Creatinine, Ser 0.83 0.40 - 1.20 mg/dL   Total Bilirubin 0.8 0.2 - 1.2 mg/dL   Alkaline Phosphatase 80 39 - 117 U/L   AST 24 0 - 37 U/L   ALT 19 0 - 35 U/L   Total Protein 6.7 6.0 - 8.3 g/dL   Albumin 4.2 3.5 - 5.2 g/dL   Calcium 9.2 8.4 - 10.5 mg/dL   GFR 74.87 >60.00 mL/min   Objective  Body mass index is 24.07 kg/m. Wt Readings from Last 3 Encounters:  08/01/17 163 lb (73.9 kg)  06/18/17 164 lb 3.2 oz (74.5 kg)   Temp Readings from Last 3 Encounters:  08/01/17 98.2 F (36.8 C) (Oral)  06/18/17 98.1 F (36.7 C) (Oral)   BP Readings from Last 3 Encounters:  08/01/17 110/76  06/18/17 106/60   Pulse Readings from Last 3 Encounters:  08/01/17 67  06/18/17 60    Physical Exam  Constitutional: She is oriented to person,  place, and time. Vital signs are normal. She appears well-developed and well-nourished. She is cooperative.  HENT:  Head: Normocephalic and atraumatic.  Mouth/Throat: Oropharynx is clear and moist and mucous membranes are normal.  Eyes: Pupils are equal, round, and reactive to light. Conjunctivae are normal.  Cardiovascular: Normal rate, regular rhythm and normal heart sounds.  Pulmonary/Chest: Effort normal and breath sounds normal. Right breast exhibits no inverted nipple, no mass, no nipple discharge, no skin change and no tenderness. Left breast exhibits no inverted nipple, no mass, no nipple discharge, no skin change and no tenderness. No breast swelling, tenderness, discharge or bleeding. Breasts are symmetrical.  Abdominal: Soft. Bowel sounds are normal.  Neurological: She is alert and oriented to person, place, and time. Gait normal.  Skin: Skin is warm and dry.     Psychiatric: She has a normal mood and affect. Her speech is normal and behavior is normal. Judgment and thought content normal. Cognition and memory are normal.  Nursing note and vitals reviewed.   Assessment   1. Annual exam  2. Vitamin d def  3. Thyroid nodule left 4.1 cm  4. Lipoma arms  Plan   1. Cont exercise and healthy diet choices  Breast exam with implants normal today  F/u OB/GYN pap and mammo 08/23/17  cologuard too expensive, does not want to do colonoscopy  FOBT neg today but not much sample  Pending shingrix [facility]  Tdap 07/07/14  Did not have flu shot  Pap former need records s/p hysterectomy  Eye exam my eye doc last week  Former smoker quit 1988 smoked 13 years see last note  Hep C neg 04/16/15  Declines hep B vaccine   2. rec D3 1000 IU qd  3. Pending appt with endocrine 09/24/17  4. Referred to Dr. Doreatha Lew    Provider: Dr. Olivia Mackie McLean-Scocuz[facility]

## 2017-08-01 NOTE — Patient Instructions (Addendum)
[facility]  Vitamin D3 1000 IU  Make sure you ask for 3d mammogram with implants  I will refer you to general surgery for arm lesions    Vitamin D Deficiency Vitamin D deficiency is when your body does not have enough vitamin D. Vitamin D is important to your body for many reasons:  It helps the body to absorb two important minerals, called calcium and phosphorus.  It plays a role in bone health.  It may help to prevent some diseases, such as diabetes and multiple sclerosis.  It plays a role in muscle function, including heart function.  You can get vitamin D by:  Eating foods that naturally contain vitamin D.  Eating or drinking milk or other dairy products that have vitamin D added to them.  Taking a vitamin D supplement or a multivitamin supplement that contains vitamin D.  Being in the sun. Your body naturally makes vitamin D when your skin is exposed to sunlight. Your body changes the sunlight into a form of the vitamin that the body can use.  If vitamin D deficiency is severe, it can cause a condition in which your bones become soft. In adults, this condition is called osteomalacia. In children, this condition is called rickets. What are the causes? Vitamin D deficiency may be caused by:  Not eating enough foods that contain vitamin D.  Not getting enough sun exposure.  Having certain digestive system diseases that make it difficult for your body to absorb vitamin D. These diseases include Crohn disease, chronic pancreatitis, and cystic fibrosis.  Having a surgery in which a part of the stomach or a part of the small intestine is removed.  Being obese.  Having chronic kidney disease or liver disease.  What increases the risk? This condition is more likely to develop in:  Older people.  People who do not spend much time outdoors.  People who live in a long-term care facility.  People who have had broken bones.  People with weak or thin bones  (osteoporosis).  People who have a disease or condition that changes how the body absorbs vitamin D.  People who have dark skin.  People who take certain medicines, such as steroid medicines or certain seizure medicines.  People who are overweight or obese.  What are the signs or symptoms? In mild cases of vitamin D deficiency, there may not be any symptoms. If the condition is severe, symptoms may include:  Bone pain.  Muscle pain.  Falling often.  Broken bones caused by a minor injury.  How is this diagnosed? This condition is usually diagnosed with a blood test. How is this treated? Treatment for this condition may depend on what caused the condition. Treatment options include:  Taking vitamin D supplements.  Taking a calcium supplement. Your health care provider will suggest what dose is best for you.  Follow these instructions at home:  Take medicines and supplements only as told by your health care provider.  Eat foods that contain vitamin D. Choices include: ? Fortified dairy products, cereals, or juices. Fortified means that vitamin D has been added to the food. Check the label on the package to be sure. ? Fatty fish, such as salmon or trout. ? Eggs. ? Oysters.  Do not use a tanning bed.  Maintain a healthy weight. Lose weight, if needed.  Keep all follow-up visits as told by your health care provider. This is important. Contact a health care provider if:  Your symptoms do not  go away.  You feel like throwing up (nausea) or you throw up (vomit).  You have fewer bowel movements than usual or it is difficult for you to have a bowel movement (constipation). This information is not intended to replace advice given to you by your health care provider. Make sure you discuss any questions you have with your health care provider. Document Released: 05/28/2011 Document Revised: 08/17/2015 Document Reviewed: 07/21/2014 Elsevier Interactive Patient Education  2018  [employer].

## 2017-08-23 ENCOUNTER — Ambulatory Visit (INDEPENDENT_AMBULATORY_CARE_PROVIDER_SITE_OTHER): Payer: BLUE CROSS/BLUE SHIELD | Admitting: Obstetrics and Gynecology

## 2017-08-23 ENCOUNTER — Other Ambulatory Visit: Payer: Self-pay

## 2017-08-23 ENCOUNTER — Encounter: Payer: Self-pay | Admitting: Obstetrics and Gynecology

## 2017-08-23 VITALS — BP 118/64 | HR 66 | Resp 14 | Ht 69.0 in | Wt 162.0 lb

## 2017-08-23 DIAGNOSIS — Z01419 Encounter for gynecological examination (general) (routine) without abnormal findings: Secondary | ICD-10-CM

## 2017-08-23 DIAGNOSIS — Z1211 Encounter for screening for malignant neoplasm of colon: Secondary | ICD-10-CM | POA: Diagnosis not present

## 2017-08-23 NOTE — Patient Instructions (Addendum)
EXERCISE AND DIET:  We recommended that you start or continue a regular exercise program for good health. Regular exercise means any activity that makes your heart beat faster and makes you sweat.  We recommend exercising at least 30 minutes per day at least 3 days a week, preferably 4 or 5.  We also recommend a diet low in fat and sugar.  Inactivity, poor dietary choices and obesity can cause diabetes, heart attack, stroke, and kidney damage, among others.    ALCOHOL AND SMOKING:  Women should limit their alcohol intake to no more than 7 drinks/beers/glasses of wine (combined, not each!) per week. Moderation of alcohol intake to this level decreases your risk of breast cancer and liver damage. And of course, no recreational drugs are part of a healthy lifestyle.  And absolutely no smoking or even second hand smoke. Most people know smoking can cause heart and lung diseases, but did you know it also contributes to weakening of your bones? Aging of your skin?  Yellowing of your teeth and nails?  CALCIUM AND VITAMIN D:  Adequate intake of calcium and Vitamin D are recommended.  The recommendations for exact amounts of these supplements seem to change often, but generally speaking 1,200 mg of calcium (either carbonate or citrate) and 800 units of Vitamin D per day seems prudent. Certain women may benefit from higher intake of Vitamin D.  If you are among these women, your doctor will have told you during your visit.    PAP SMEARS:  Pap smears, to check for cervical cancer or precancers,  have traditionally been done yearly, although recent scientific advances have shown that most women can have pap smears less often.  However, every woman still should have a physical exam from her gynecologist every year. It will include a breast check, inspection of the vulva and vagina to check for abnormal growths or skin changes, a visual exam of the cervix, and then an exam to evaluate the size and shape of the uterus and  ovaries.  And after 59 years of age, a rectal exam is indicated to check for rectal cancers. We will also provide age appropriate advice regarding health maintenance, like when you should have certain vaccines, screening for sexually transmitted diseases, bone density testing, colonoscopy, mammograms, etc.   MAMMOGRAMS:  All women over 40 years old should have a yearly mammogram. Many facilities now offer a "3D" mammogram, which may cost around $50 extra out of pocket. If possible,  we recommend you accept the option to have the 3D mammogram performed.  It both reduces the number of women who will be called back for extra views which then turn out to be normal, and it is better than the routine mammogram at detecting truly abnormal areas.    COLONOSCOPY:  Colonoscopy to screen for colon cancer is recommended for all women at age 50.  We know, you hate the idea of the prep.  We agree, BUT, having colon cancer and not knowing it is worse!!  Colon cancer so often starts as a polyp that can be seen and removed at colonscopy, which can quite literally save your life!  And if your first colonoscopy is normal and you have no family history of colon cancer, most women don't have to have it again for 10 years.  Once every ten years, you can do something that may end up saving your life, right?  We will be happy to help you get it scheduled when you are ready.    Be sure to check your insurance coverage so you understand how much it will cost.  It may be covered as a preventative service at no cost, but you should check your particular policy.      Breast Self-Awareness Breast self-awareness means being familiar with how your breasts look and feel. It involves checking your breasts regularly and reporting any changes to your health care provider. Practicing breast self-awareness is important. A change in your breasts can be a sign of a serious medical problem. Being familiar with how your breasts look and feel allows  you to find any problems early, when treatment is more likely to be successful. All women should practice breast self-awareness, including women who have had breast implants. How to do a breast self-exam One way to learn what is normal for your breasts and whether your breasts are changing is to do a breast self-exam. To do a breast self-exam: Look for Changes  1. Remove all the clothing above your waist. 2. Stand in front of a mirror in a room with good lighting. 3. Put your hands on your hips. 4. Push your hands firmly downward. 5. Compare your breasts in the mirror. Look for differences between them (asymmetry), such as: ? Differences in shape. ? Differences in size. ? Puckers, dips, and bumps in one breast and not the other. 6. Look at each breast for changes in your skin, such as: ? Redness. ? Scaly areas. 7. Look for changes in your nipples, such as: ? Discharge. ? Bleeding. ? Dimpling. ? Redness. ? A change in position. Feel for Changes  Carefully feel your breasts for lumps and changes. It is best to do this while lying on your back on the floor and again while sitting or standing in the shower or tub with soapy water on your skin. Feel each breast in the following way:  Place the arm on the side of the breast you are examining above your head.  Feel your breast with the other hand.  Start in the nipple area and make  inch (2 cm) overlapping circles to feel your breast. Use the pads of your three middle fingers to do this. Apply light pressure, then medium pressure, then firm pressure. The light pressure will allow you to feel the tissue closest to the skin. The medium pressure will allow you to feel the tissue that is a little deeper. The firm pressure will allow you to feel the tissue close to the ribs.  Continue the overlapping circles, moving downward over the breast until you feel your ribs below your breast.  Move one finger-width toward the center of the body.  Continue to use the  inch (2 cm) overlapping circles to feel your breast as you move slowly up toward your collarbone.  Continue the up and down exam using all three pressures until you reach your armpit.  Write Down What You Find  Write down what is normal for each breast and any changes that you find. Keep a written record with breast changes or normal findings for each breast. By writing this information down, you do not need to depend only on memory for size, tenderness, or location. Write down where you are in your menstrual cycle, if you are still menstruating. If you are having trouble noticing differences in your breasts, do not get discouraged. With time you will become more familiar with the variations in your breasts and more comfortable with the exam. How often should I examine my breasts? Examine   your breasts every month. If you are breastfeeding, the best time to examine your breasts is after a feeding or after using a breast pump. If you menstruate, the best time to examine your breasts is 5-7 days after your period is over. During your period, your breasts are lumpier, and it may be more difficult to notice changes. When should I see my health care provider? See your health care provider if you notice:  A change in shape or size of your breasts or nipples.  A change in the skin of your breast or nipples, such as a reddened or scaly area.  Unusual discharge from your nipples.  A lump or thick area that was not there before.  Pain in your breasts.  Anything that concerns you.  This information is not intended to replace advice given to you by your health care provider. Make sure you discuss any questions you have with your health care provider. Document Released: 03/05/2005 Document Revised: 08/11/2015 Document Reviewed: 01/23/2015 Elsevier Interactive Patient Education  2018 [employer]  

## 2017-08-23 NOTE — Progress Notes (Signed)
56 y.o. G2P2 DivorcedCaucasianF here for annual exam.  H/O TVH for fibroids (still has her ovaries). She had vasomotor symptoms, they resolved, now back. Overall tolerable.  Not sexually active.     No LMP recorded. Patient has had a hysterectomy.          Sexually active: No.  The current method of family planning is status post hysterectomy.    Exercising: Yes.    bootcamp, walking Smoker:  Former smoker   Health Maintenance: Pap:  2018 at [facility] in [place]  History of abnormal Pap:  no MMG:  08/08/16 BIRADS 2 benign- patient has copy  Colonoscopy:  Never, tried to have one, got very sick with the prep, cologuard not covered by her insurance.   BMD:   never TDaP:  07/05/14  Gardasil: no    reports that she quit smoking about 31 years ago. She has never used smokeless tobacco. She reports that she drinks alcohol. She reports that she does not use drugs. Intermittent ETOH. Accountant. 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 5 grand children, 3 are local. Other's in [place].  Past Medical History:  Diagnosis Date  . Chicken pox   . Fibroid     Past Surgical History:  Procedure Laterality Date  . ABLATION    . BREAST ENHANCEMENT SURGERY     2010  . BREAST SURGERY     breast bx 2015 benign breasts cysts   . FOOT SURGERY     left removed partial nerve   . TONSILLECTOMY    . TOTAL VAGINAL HYSTERECTOMY     fibroids     Current Outpatient Medications  Medication Sig Dispense Refill  . Biotin 1000 MCG tablet Take 1,000 mcg by mouth 3 (three) times daily.    . Cholecalciferol (VITAMIN D3 PO) Take by mouth.    . Multiple Vitamins-Minerals (CENTRUM SILVER 50+WOMEN) TABS Take by mouth.     No current facility-administered medications for this visit.     Family History  Problem Relation Age of Onset  . Diabetes Mother   . Heart disease Mother   . Depression Mother   . Alcohol abuse Father   . Cancer Father        mesthelioma house with asbestos   . Skin cancer Sister   . Skin  cancer Brother   . Bladder Cancer Maternal Aunt     Review of Systems  Constitutional: Negative.   HENT: Negative.   Eyes: Negative.   Respiratory: Negative.   Cardiovascular: Negative.   Gastrointestinal: Negative.   Endocrine: Negative.   Genitourinary: Negative.   Musculoskeletal: Negative.   Skin: Negative.   Allergic/Immunologic: Negative.   Neurological: Negative.   Hematological: Negative.   Psychiatric/Behavioral: Negative.     Exam:   BP 118/64   Pulse 66   Resp 14   Ht 5\' 9"  (1.753 m)   Wt 162 lb (73.5 kg)   BMI 23.92 kg/m   Weight change: @WEIGHTCHANGE @ Height:   Height: 5\' 9"  (175.3 cm)  Ht Readings from Last 3 Encounters:  08/23/17 5\' 9"  (1.753 m)  08/01/17 5\' 9"  (1.753 m)  06/18/17 5\' 6"  (1.676 m)    General appearance: alert, cooperative and appears stated age Head: Normocephalic, without obvious abnormality, atraumatic Neck: no adenopathy, supple, symmetrical, trachea midline and thyroid left sided goiter Lungs: clear to auscultation bilaterally Cardiovascular: regular rate and rhythm Breasts: normal appearance, no masses or tenderness Abdomen: soft, non-tender; non distended,  no masses,  no organomegaly Extremities: extremities normal,  atraumatic, no cyanosis or edema Skin: Skin color, texture, turgor normal. No rashes or lesions Lymph nodes: Cervical, supraclavicular, and axillary nodes normal. No abnormal inguinal nodes palpated Neurologic: Grossly normal   Pelvic: External genitalia:  no lesions              Urethra:  normal appearing urethra with no masses, tenderness or lesions              Bartholins and Skenes: normal                 Vagina: atrophic appearing vagina with normal color and discharge, no lesions              Cervix: absent               Bimanual Exam:  Uterus:  uterus absent              Adnexa: no mass, fullness, tenderness               Rectovaginal: Confirms               Anus:  normal sphincter tone, no  lesions  Chaperone was present for exam.  A:  Well Woman with normal exam  H/O hysterectomy  Left sided goiter, she has had an ultrasound, needs a biopsy, has an appointment  P:   No pap needed  IFOB  Mammogram  Labs and immunizations with her primary  Discussed breast self exam  Discussed calcium and vit D intake

## 2017-08-28 ENCOUNTER — Other Ambulatory Visit: Payer: Self-pay | Admitting: Obstetrics and Gynecology

## 2017-08-28 DIAGNOSIS — Z1231 Encounter for screening mammogram for malignant neoplasm of breast: Secondary | ICD-10-CM

## 2017-08-31 DIAGNOSIS — Z1211 Encounter for screening for malignant neoplasm of colon: Secondary | ICD-10-CM | POA: Diagnosis not present

## 2017-09-02 LAB — FECAL OCCULT BLOOD, IMMUNOCHEMICAL: FECAL OCCULT BLD: NEGATIVE

## 2017-09-02 LAB — SPECIMEN STATUS REPORT

## 2017-09-05 ENCOUNTER — Encounter: Payer: Self-pay | Admitting: *Deleted

## 2017-09-10 ENCOUNTER — Encounter: Payer: Self-pay | Admitting: General Surgery

## 2017-09-10 ENCOUNTER — Ambulatory Visit: Payer: Self-pay

## 2017-09-10 ENCOUNTER — Ambulatory Visit (INDEPENDENT_AMBULATORY_CARE_PROVIDER_SITE_OTHER): Payer: BLUE CROSS/BLUE SHIELD | Admitting: General Surgery

## 2017-09-10 VITALS — BP 128/72 | HR 80 | Resp 14 | Ht 69.0 in | Wt 163.0 lb

## 2017-09-10 DIAGNOSIS — D179 Benign lipomatous neoplasm, unspecified: Secondary | ICD-10-CM | POA: Diagnosis not present

## 2017-09-10 DIAGNOSIS — E079 Disorder of thyroid, unspecified: Secondary | ICD-10-CM | POA: Diagnosis not present

## 2017-09-10 DIAGNOSIS — R221 Localized swelling, mass and lump, neck: Secondary | ICD-10-CM

## 2017-09-10 DIAGNOSIS — E041 Nontoxic single thyroid nodule: Secondary | ICD-10-CM | POA: Diagnosis not present

## 2017-09-10 NOTE — Progress Notes (Signed)
Patient ID: Madison [name], female   DOB: 05/21/1960, 57 y.o.   MRN: [id number]  Chief Complaint  Patient presents with  . Lipoma    HPI Madison [name] is a 57 y.o. female.  Here for evaluation of multiple lipomas referred by Dr. Olivia Mackie McLean-Scocuzza . She states she noticed them in the last year. No change in size. The lipoma on the right is tender to touch. No pain. No change in size.    In January she noticed a mass on the front of her neck.  There is some question whether she is noticed some difficulty with deglutition, although she is experienced no weight loss.  No change in cold tolerance or hair texture has been noted.    She is scheduled to see the endocrinologist next month.   Sister, Margarita Grizzle, RN is present at visit.   The patient moved here from [place] about a year ago.  No previous known thyroid abnormality.  HPI  Past Medical History:  Diagnosis Date  . Chicken pox   . Fibroid     Past Surgical History:  Procedure Laterality Date  . ABLATION    . BREAST ENHANCEMENT SURGERY     2010  . BREAST SURGERY     breast bx 2015 benign breasts cysts   . FOOT SURGERY     left removed partial nerve   . TONSILLECTOMY    . TOTAL VAGINAL HYSTERECTOMY     fibroids     Family History  Problem Relation Age of Onset  . Diabetes Mother   . Heart disease Mother   . Depression Mother   . Alcohol abuse Father   . Cancer Father        mesthelioma house with asbestos   . Skin cancer Sister   . Skin cancer Brother   . Bladder Cancer Maternal Aunt     Social History Social History   Tobacco Use  . Smoking status: Former Smoker    Last attempt to quit: 1988    Years since quitting: 31.5  . Smokeless tobacco: Never Used  . Tobacco comment: smoked 13 years ago 1/2 ppd   Substance Use Topics  . Alcohol use: Yes    Comment: occasionally   . Drug use: Never    No Known Allergies  Current Outpatient Medications  Medication Sig Dispense Refill  . Cholecalciferol (VITAMIN  D3 PO) Take by mouth.    . Multiple Vitamins-Minerals (CENTRUM SILVER 50+WOMEN) TABS Take by mouth.     No current facility-administered medications for this visit.     Review of Systems Review of Systems  Constitutional: Negative.   Respiratory: Negative.   Cardiovascular: Negative.     Blood pressure 128/72, pulse 80, resp. rate 14, height 5\' 9"  (1.753 m), weight 163 lb (73.9 kg).  Physical Exam Physical Exam  Neck: Thyroid mass present.    Lymphadenopathy:       Right: No supraclavicular adenopathy present.       Left: No supraclavicular adenopathy present.  Skin:  Right forearm 2 lipoma  5 mm Right upper arm 8 mm. Two on the right anterior forearm.  Left postoral medically 3 lipoma  6 mm Left anterior thigh 8 mm      Data Reviewed Laboratory studies of Jul 18, 2017 including TSH, free T3 and T4 level were all normal.  Thyroid ultrasound of June 25, 2017 reviewed.    Assessment    Multiple soft tissue nodules consistent with lipomas.  Observation alone  is warranted at this time.  Symptomatic areas can be removed if needed.  The only area that is particularly tender is on the right upper arm and only with direct pressure.  New left thyroid mass, cystic and solid component on ultrasound.      Plan   I spoke with the patient's PCP, and she was amenable to have an FNA completed today to facilitate her upcoming evaluation with endocrinology.  The procedure was discussed with the patient and she was amenable.  Ultrasound examination of the left lobe of the thyroid showed a dominant mass encompassing essentially the entire middle and inferior thirds of the gland.  This measured in its cystic component about 2.38 cm towards the inferior aspect and 2.56 x 2.67 and a more solid component.  Longitudinally this measured up to 4.1 cm.  1 cc of 1% plain Xylocaine was utilized after ChloraPrep and well-tolerated.  The cystic area was aspirated first and 5 cc of turbid brown  fluid was obtained.  This was mixed with an equal volume of cytology preservative.  After aspiration the volume had decreased to 1.3 x 1.65 cm.  The solid component was then aspirated with multiple passes of a 22-gauge needle.  Total volume of 1 cc of blood and contents were obtained, mixed with cytology preservative and sent in a separate container.  Both samples are from the same cystic/solid mass involving the left lobe.  The patient tolerated the procedure well.  No bleeding was noted.  Indication for early follow-up with pain, swelling or bleeding were discussed.  Follow up appointment to be announced.   HPI, Physical Exam, Assessment and Plan have been scribed under the direction and in the presence of Hervey Ard, MD.  Gaspar Cola, CMA  [facility] Glaus 09/10/2017, 8:56 PM

## 2017-09-10 NOTE — Patient Instructions (Signed)
The patient is aware to call back for any questions or concerns.  

## 2017-09-15 DIAGNOSIS — R300 Dysuria: Secondary | ICD-10-CM | POA: Diagnosis not present

## 2017-09-15 DIAGNOSIS — N3001 Acute cystitis with hematuria: Secondary | ICD-10-CM | POA: Diagnosis not present

## 2017-09-24 DIAGNOSIS — E041 Nontoxic single thyroid nodule: Secondary | ICD-10-CM | POA: Diagnosis not present

## 2017-09-27 DIAGNOSIS — E041 Nontoxic single thyroid nodule: Secondary | ICD-10-CM | POA: Diagnosis not present

## 2017-09-30 ENCOUNTER — Encounter: Payer: Self-pay | Admitting: Internal Medicine

## 2017-09-30 ENCOUNTER — Ambulatory Visit (INDEPENDENT_AMBULATORY_CARE_PROVIDER_SITE_OTHER): Payer: BLUE CROSS/BLUE SHIELD | Admitting: Internal Medicine

## 2017-09-30 VITALS — BP 112/68 | HR 69 | Temp 98.3°F | Resp 16 | Ht 69.0 in | Wt 163.5 lb

## 2017-09-30 DIAGNOSIS — H6690 Otitis media, unspecified, unspecified ear: Secondary | ICD-10-CM

## 2017-09-30 DIAGNOSIS — E079 Disorder of thyroid, unspecified: Secondary | ICD-10-CM

## 2017-09-30 MED ORDER — AMOXICILLIN-POT CLAVULANATE 875-125 MG PO TABS: 1.0000 | ORAL_TABLET | Freq: Two times a day (BID) | ORAL | 0 refills | Status: DC

## 2017-09-30 MED ORDER — CIPROFLOXACIN-DEXAMETHASONE 0.3-0.1 % OT SUSP
4.0000 [drp] | Freq: Two times a day (BID) | OTIC | 0 refills | Status: DC
Start: 2017-09-30 — End: 2018-08-19

## 2017-09-30 NOTE — Progress Notes (Addendum)
Chief Complaint  Patient presents with  . Ear Pain   Left ear pain since Sat/Sunday felt hot last night no drainage but left ear is closed and itching. Denies sore throat   Reports 2 weeks ago had UTI given Abx telemedicine ? Name did not work so [facility] and [facility] helped    Review of Systems  Constitutional: Negative for fever.  HENT: Positive for ear pain. Negative for ear discharge, hearing loss and sore throat.   Respiratory: Negative for shortness of breath.   Cardiovascular: Negative for chest pain.   Past Medical History:  Diagnosis Date  . Chicken pox   . Fibroid    Past Surgical History:  Procedure Laterality Date  . ABLATION    . BREAST ENHANCEMENT SURGERY     20 10  . BREAST SURGERY     breast bx 2015 benign breasts cysts   . FOOT SURGERY     left removed partial nerve   . TONSILLECTOMY    . TOTAL VAGINAL HYSTERECTOMY     fibroids    Family History  Problem Relation Age of Onset  . Diabetes Mother   . Heart disease Mother   . Depression Mother   . Alcohol abuse Father   . Cancer Father        mesthelioma house with asbestos   . Skin cancer Sister   . Skin cancer Brother   . Bladder Cancer Maternal Aunt    Social History   Socioeconomic History  . Marital status: Divorced    Spouse name: Not on file  . Number of children: Not on file  . Years of education: Not on file  . Highest education level: Not on file  Occupational History  . Not on file  Social Needs  . Financial resource strain: Not on file  . Food insecurity:    Worry: Not on file    Inability: Not on file  . Transportation needs:    Medical: Not on file    Non-medical: Not on file  Tobacco Use  . Smoking status: Former Smoker    Last attempt to quit: 1988    Years since quitting: 31.5  . Smokeless tobacco: Never Used  . Tobacco comment: smoked 13 years ago 1/2 ppd   Substance and Sexual Activity  . Alcohol use: Yes    Comment: occasionally   . Drug use: Never   . Sexual activity: Not Currently    Birth control/protection: Surgical    Comment: Hysterectomy 2008   Lifestyle  . Physical activity:    Days per week: Not on file    Minutes per session: Not on file  . Stress: Not on file  Relationships  . Social connections:    Talks on phone: Not on file    Gets together: Not on file    Attends religious service: Not on file    Active member of club or organization: Not on file    Attends meetings of clubs or organizations: Not on file    Relationship status: Not on file  . Intimate partner violence:    Fear of current or ex partner: Not on file    Emotionally abused: Not on file    Physically abused: Not on file    Forced sexual activity: Not on file  Other Topics Concern  . 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 Cholecalciferol (VITAMIN D3 PO) Take by mouth.  . Multiple Vitamins-Minerals (CENTRUM SILVER 50+WOMEN) TABS Take by mouth.   No Known Allergies Recent Results (from the past 2160 hour(s))  T3, free     Status: None   Collection Time: 07/18/17  7:59 AM  Result Value Ref Range   T3, Free 3.5 2.3 - 4.2 pg/mL  Vitamin D (25 hydroxy)     Status: Abnormal   Collection Time: 07/18/17  7:59 AM  Result Value Ref Range   VITD 29.56 (L) 30.00 - 100.00 ng/mL  Hepatitis B surface antibody     Status: Abnormal   Collection Time: 07/18/17  7:59 AM  Result Value Ref Range   Hepatitis B-Post <5 (L) > OR = 10 mIU/mL    Comment: . Patient does not have immunity to hepatitis B virus. . For additional information, please refer to http://education.questdiagn[facility].com/faq/FAQ105 (This link is being provided for informational/ educational purposes only).   Urinalysis, Routine w reflex microscopic     Status: None   Collection Time: 07/18/17  7:59 AM  Result Value Ref Range   Color, Urine YELLOW Yellow;Lt.  Yellow   APPearance CLEAR Clear   Specific Gravity, Urine 1.015 1.000 - 1.030   pH 6.5 5.0 - 8.0   Total Protein, Urine NEGATIVE Negative   Urine Glucose NEGATIVE Negative   Ketones, ur NEGATIVE Negative   Bilirubin Urine NEGATIVE Negative   Hgb urine dipstick NEGATIVE Negative   Urobilinogen, UA 0.2 0.0 - 1.0   Leukocytes, UA NEGATIVE Negative   Nitrite NEGATIVE Negative   WBC, UA none seen 0-2/hpf   RBC / HPF none seen 0-2/hpf   Squamous Epithelial / LPF Rare(0-4/hpf) Rare(0-4/hpf)  TSH     Status: None   Collection Time: 07/18/17  7:59 AM  Result Value Ref Range   TSH 2.03 0.35 - 4.50 uIU/mL  T4, free     Status: None   Collection Time: 07/18/17  7:59 AM  Result Value Ref Range   Free T4 0.83 0.60 - 1.60 ng/dL    Comment: Specimens from patients who are undergoing biotin therapy and /or ingesting biotin supplements may contain high levels of biotin.  The higher biotin concentration in these specimens interferes with this Free T4 assay.  Specimens that contain high levels  of biotin may cause false high results for this Free T4 assay.  Please interpret results in light of the total clinical presentation of the patient.    Lipid panel     Status: None   Collection Time: 07/18/17  7:59 AM  Result Value Ref Range   Cholesterol 177 0 - 200 mg/dL    Comment: ATP III Classification       Desirable:  < 200 mg/dL               Borderline High:  200 - 239 mg/dL          High:  > = 240 mg/dL   Triglycerides 71.0 0.0 - 149.0 mg/dL    Comment: Normal:  <150 mg/dLBorderline High:  150 - 199 mg/dL   HDL 67.70 >39.00 mg/dL   VLDL 14.2 0.0 - 40.0 mg/dL   LDL Cholesterol 96 0 - 99 mg/dL   Total CHOL/HDL Ratio 3     Comment:                Men  Women1/2 Average Risk     3.4          3.3Average Risk          5.0          4.42X Average Risk          9.6          7.13X Average Risk          15.0          11.0                       NonHDL 109.71     Comment: NOTE:  Non-HDL goal should be  30 mg/dL higher than patient's LDL goal (i.e. LDL goal of < 70 mg/dL, would have non-HDL goal of < 100 mg/dL)  CBC with Differential/Platelet     Status: Abnormal   Collection Time: 07/18/17  7:59 AM  Result Value Ref Range   WBC 4.8 4.0 - 10.5 K/uL   RBC 4.52 3.87 - 5.11 Mil/uL   Hemoglobin 14.6 12.0 - 15.0 g/dL   HCT 42.2 36.0 - 46.0 %   MCV 93.4 78.0 - 100.0 fl   MCHC 34.6 30.0 - 36.0 g/dL   RDW 12.3 11.5 - 15.5 %   Platelets 301.0 150.0 - 400.0 K/uL   Neutrophils Relative % 43.5 43.0 - 77.0 %   Lymphocytes Relative 39.0 12.0 - 46.0 %   Monocytes Relative 12.8 (H) 3.0 - 12.0 %   Eosinophils Relative 3.4 0.0 - 5.0 %   Basophils Relative 1.3 0.0 - 3.0 %   Neutro Abs 2.1 1.4 - 7.7 K/uL   Lymphs Abs 1.9 0.7 - 4.0 K/uL   Monocytes Absolute 0.6 0.1 - 1.0 K/uL   Eosinophils Absolute 0.2 0.0 - 0.7 K/uL   Basophils Absolute 0.1 0.0 - 0.1 K/uL  Comprehensive metabolic panel     Status: None   Collection Time: 07/18/17  7:59 AM  Result Value Ref Range   Sodium 142 135 - 145 mEq/L   Potassium 4.2 3.5 - 5.1 mEq/L   Chloride 106 96 - 112 mEq/L   CO2 28 19 - 32 mEq/L   Glucose, Bld 93 70 - 99 mg/dL   BUN 17 6 - 23 mg/dL   Creatinine, Ser 0.83 0.40 - 1.20 mg/dL   Total Bilirubin 0.8 0.2 - 1.2 mg/dL   Alkaline Phosphatase 80 39 - 117 U/L   AST 24 0 - 37 U/L   ALT 19 0 - 35 U/L   Total Protein 6.7 6.0 - 8.3 g/dL   Albumin 4.2 3.5 - 5.2 g/dL   Calcium 9.2 8.4 - 10.5 mg/dL   GFR 74.87 >60.00 mL/min  POCT occult blood stool     Status: Normal   Collection Time: 08/01/17  8:48 AM  Result Value Ref Range   Fecal Occult Blood, POC Negative Negative   Card #1 Date     Card #2 Fecal Occult Blod, POC     Card #2 Date     Card #3 Fecal Occult Blood, POC     Card #3 Date    Fecal occult blood, imunochemical     Status: None   Collection Time: 08/31/17 12:00 AM  Result Value Ref Range   Fecal Occult Bld Negative Negative  Specimen status report     Status: None   Collection Time: 08/31/17  12:00 AM  Result Value Ref Range   specimen status report Comment  Comment: Please note Please note The date and/or time of collection was not indicated on the requisition as required by state and federal law.  The date of receipt of the specimen was used as the collection date if not supplied.    Objective  Body mass index is 24.14 kg/m. Wt Readings from Last 3 Encounters:  09/30/17 163 lb 8 oz (74.2 kg)  09/10/17 163 lb (73.9 kg)  08/23/17 162 lb (73.5 kg)   Temp Readings from Last 3 Encounters:  09/30/17 98.3 F (36.8 C) (Oral)  08/01/17 98.2 F (36.8 C) (Oral)  06/18/17 98.1 F (36.7 C) (Oral)   BP Readings from Last 3 Encounters:  09/30/17 112/68  09/10/17 128/72  08/23/17 118/64   Pulse Readings from Last 3 Encounters:  09/30/17 69  09/10/17 80  08/23/17 66    Physical Exam  Constitutional: She is oriented to person, place, and time. Vital signs are normal. She appears well-developed and well-nourished. She is cooperative.  HENT:  Head: Normocephalic and atraumatic.  Right Ear: Hearing normal. No decreased hearing is noted.  Left Ear: Hearing normal. There is swelling and tenderness. No drainage. No decreased hearing is noted.  Mouth/Throat: Oropharynx is clear and moist and mucous membranes are normal.  Left ear canal closed w/o drainage   Eyes: Pupils are equal, round, and reactive to light. Conjunctivae are normal.  Cardiovascular: Normal rate, regular rhythm and normal heart sounds.  Pulmonary/Chest: Effort normal and breath sounds normal.  Neurological: She is alert and oriented to person, place, and time. Gait normal.  Skin: Skin is warm, dry and intact.  Psychiatric: She has a normal mood and affect. Her speech is normal and behavior is normal. Judgment and thought content normal.  Nursing note and vitals reviewed.   Assessment   1. Left otitis media Plan   1.  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 Harlow Asa   Provider: Dr. Olivia Mackie McLean-Scocuzza-Internal Medicine

## 2017-09-30 NOTE — Patient Instructions (Addendum)
Call back or My Chart Wednesday  F/u in 6-12 months   Otitis Media, Adult Otitis media occurs when there is inflammation and fluid in the middle ear. Your middle ear is a part of the ear that contains bones for hearing as well as air that helps send sounds to your brain. What are the causes? This condition is caused by a blockage in the eustachian tube. This tube drains fluid from the ear to the back of the nose (nasopharynx). A blockage in this tube can be caused by an object or by swelling (edema) in the tube. Problems that can cause a blockage include:  A cold or other upper respiratory infection.  Allergies.  An irritant, such as tobacco smoke.  Enlarged adenoids. The adenoids are areas of soft tissue located high in the back of the throat, behind the nose and the roof of the mouth.  A mass in the nasopharynx.  Damage to the ear caused by pressure changes (barotrauma).  What are the signs or symptoms? Symptoms of this condition include:  Ear pain.  A fever.  Decreased hearing.  A headache.  Tiredness (lethargy).  Fluid leaking from the ear.  Ringing in the ear.  How is this diagnosed? This condition is diagnosed with a physical exam. During the exam your health care provider will use an instrument called an otoscope to look into your ear and check for redness, swelling, and fluid. He or she will also ask about your symptoms. Your health care provider may also order tests, such as:  A test to check the movement of the eardrum (pneumatic otoscopy). This test is done by squeezing a small amount of air into the ear.  A test that changes air pressure in the middle ear to check how well the eardrum moves and whether the eustachian tube is working (tympanogram).  How is this treated? This condition usually goes away on its own within 3-5 days. But if the condition is caused by a bacteria infection and does not go away own its own, or keeps coming back, your health care  provider may:  Prescribe antibiotic medicines to treat the infection.  Prescribe or recommend medicines to control pain.  Follow these instructions at home:  Take over-the-counter and prescription medicines only as told by your health care provider.  If you were prescribed an antibiotic medicine, take it as told by your health care provider. Do not stop taking the antibiotic even if you start to feel better.  Keep all follow-up visits as told by your health care provider. This is important. Contact a health care provider if:  You have bleeding from your nose.  There is a lump on your neck.  You are not getting better in 5 days.  You feel worse instead of better. Get help right away if:  You have severe pain that is not controlled with medicine.  You have swelling, redness, or pain around your ear.  You have stiffness in your neck.  A part of your face is paralyzed.  The bone behind your ear (mastoid) is tender when you touch it.  You develop a severe headache. Summary  Otitis media is redness, soreness, and swelling of the middle ear.  This condition usually goes away on its own within 3-5 days.  If the problem does not go away in 3-5 days, your health care provider may prescribe or recommend medicines to treat your symptoms.  If you were prescribed an antibiotic medicine, take it as told by  your health care provider. This information is not intended to replace advice given to you by your health care provider. Make sure you discuss any questions you have with your health care provider. Document Released: 12/09/2003 Document Revised: 02/24/2016 Document Reviewed: 02/24/2016 Elsevier Interactive Patient Education  [employer].

## 2017-10-01 ENCOUNTER — Telehealth: Payer: Self-pay | Admitting: Internal Medicine

## 2017-10-01 NOTE — Telephone Encounter (Signed)
Left message on voicemail for pt to return call to office to discuss symptoms.    [facility] to request medication be changed to something else because the medication was $200 with the coupon and the pt could not afford the medication.   [facility] who state that they have  Floxacin with out the steroid Neomycin polymycin hydrocortisone available at the pharmacy which would be cheaper for the pt.

## 2017-10-01 NOTE — Telephone Encounter (Signed)
Pt states she noted blood out of her ear when she woke up this morning and had blood on her pillow. Pt states that the blood was coming from her inner ear but was dried up.Pt states that she felt something trickle down her left ear around lunch time today and she placed a tissue in her ear and she had a little blood mixed with fluid. Pt states that she experienced bloody drainage mixed with fluid and not frank blood. Pt denies having any blood draining from her ear at this time.Pt states her left ear is still sore and has a shooting pain that goes down her jaw. Pt states that she did have the same pain when seen for office visit on yesterday with Dr. Terese Door. Pt advised that if symptoms return to seek treatment in the Urgent Care/ED. Pt verbalized understanding.

## 2017-10-01 NOTE — Telephone Encounter (Unsigned)
Copied from [place] [id number]. Topic: Quick Communication - See Telephone Encounter >> Oct 01, 2017  1:07 PM Percell Belt A wrote: CRM for notification. See Telephone encounter for: 10/01/17. Pt called in and stated that she was seen yesterday and the pain is not any better.  She also wanted to let her know that she has blood coming out of her ear. She also feels as if her jaw is hurting.  When she went to pick up meds they on had one of them.  They did not have the drops.    Pharmacy - [facility]

## 2017-10-02 ENCOUNTER — Other Ambulatory Visit: Payer: Self-pay | Admitting: Internal Medicine

## 2017-10-02 DIAGNOSIS — H66002 Acute suppurative otitis media without spontaneous rupture of ear drum, left ear: Secondary | ICD-10-CM

## 2017-10-02 DIAGNOSIS — H66012 Acute suppurative otitis media with spontaneous rupture of ear drum, left ear: Secondary | ICD-10-CM

## 2017-10-02 MED ORDER — NEOMYCIN-POLYMYXIN-HC 3.5-10000-1 OT SOLN: 4.0000 [drp] | Freq: Four times a day (QID) | OTIC | 0 refills | Status: DC

## 2017-10-02 NOTE — Telephone Encounter (Signed)
Patient is still having drainage from ear enough to get on cotton ball in ear has sharpe pain shooting through left ear. Patient seen in office on 09/30/17 given abx , but patient is concerned about the shooting pain rated at an 8-9 on pain scale, an d drainage coming from ear.

## 2017-10-02 NOTE — Telephone Encounter (Signed)
Left message to return call to office, tried calling to check on patient.

## 2017-10-02 NOTE — Telephone Encounter (Signed)
Will refeer to ENT  [place]

## 2017-10-03 ENCOUNTER — Telehealth: Payer: Self-pay

## 2017-10-03 DIAGNOSIS — E041 Nontoxic single thyroid nodule: Secondary | ICD-10-CM | POA: Diagnosis not present

## 2017-10-03 NOTE — Telephone Encounter (Signed)
Copied from [place] [id number]. Topic: Referral - Request >> Oct 03, 2017  1:50 PM Bea Graff, NT wrote: Reason for CRM: Pt states that [facility] could not get her in until 10/18/17 and she wanted to be seen sooner. She is able to get in at [facility], Dr. Vicie Mutters on 10/10/17 and needs a referral sent to them. Fax#: [phone]. Phone#: [phone]

## 2017-10-04 DIAGNOSIS — E041 Nontoxic single thyroid nodule: Secondary | ICD-10-CM | POA: Diagnosis not present

## 2017-10-04 DIAGNOSIS — H60502 Unspecified acute noninfective otitis externa, left ear: Secondary | ICD-10-CM | POA: Diagnosis not present

## 2017-10-04 DIAGNOSIS — H6692 Otitis media, unspecified, left ear: Secondary | ICD-10-CM | POA: Diagnosis not present

## 2017-10-07 ENCOUNTER — Ambulatory Visit
Admission: RE | Admit: 2017-10-07 | Discharge: 2017-10-07 | Disposition: A | Discharge status: Home / Self Care | Payer: BLUE CROSS/BLUE SHIELD | Source: Ambulatory Visit | Attending: Obstetrics and Gynecology | Admitting: Obstetrics and Gynecology

## 2017-10-07 DIAGNOSIS — Z1231 Encounter for screening mammogram for malignant neoplasm of breast: Secondary | ICD-10-CM | POA: Diagnosis not present

## 2017-10-08 ENCOUNTER — Telehealth: Payer: Self-pay

## 2017-10-08 NOTE — Telephone Encounter (Signed)
Copied from [place] [id number]. Topic: Referral - Request >> Oct 03, 2017  1:50 PM Madison [name], NT wrote: Reason for CRM: Pt states that [facility] could not get her in until 10/18/17 and she wanted to be seen sooner. She is able to get in at [facility], Dr. Vicie Mutters on 10/10/17 and needs a referral sent to them. Fax#: [phone]. Phone#: [phone]  >> Oct 08, 2017  1:34 PM Madison [name] wrote: Relation to pt: self  Call back number: [phone]  Reason for call:  Patient states [facility] cant see her until August, patient has an appointment with Dr. Vicie Mutters Madison [name] phone [phone]) [phone] and fax # [phone] please fax referral.

## 2017-10-10 DIAGNOSIS — H60332 Swimmer's ear, left ear: Secondary | ICD-10-CM | POA: Diagnosis not present

## 2017-10-24 DIAGNOSIS — H60592 Other noninfective acute otitis externa, left ear: Secondary | ICD-10-CM | POA: Diagnosis not present

## 2017-11-13 ENCOUNTER — Ambulatory Visit: Payer: Self-pay | Admitting: Surgery

## 2017-11-13 DIAGNOSIS — D440 Neoplasm of uncertain behavior of thyroid gland: Secondary | ICD-10-CM | POA: Diagnosis not present

## 2017-11-13 DIAGNOSIS — E041 Nontoxic single thyroid nodule: Secondary | ICD-10-CM | POA: Diagnosis not present

## 2017-11-25 ENCOUNTER — Other Ambulatory Visit (HOSPITAL_COMMUNITY): Payer: BLUE CROSS/BLUE SHIELD

## 2017-11-25 NOTE — Patient Instructions (Signed)
Madison [name]  11/25/2017   Your procedure is scheduled on: 12-02-17   Report to [facility] Main  Entrance    Report to admitting at 7:30AM    Call this number if you have problems the morning of surgery [phone]     Remember: Do not eat food or drink liquids :After Midnight.     Take these medicines the morning of surgery with A SIP OF WATER: none                                You may not have any metal on your body including hair pins and              piercings  Do not wear jewelry, make-up, lotions, powders or perfumes, deodorant             Do not wear nail polish.  Do not shave  48 hours prior to surgery.     Do not bring valuables to the hospital. [place].  Contacts, dentures or bridgework may not be worn into surgery.  Leave suitcase in the car. After surgery it may be brought to your room.                 Please read over the following fact sheets you were given: _____________________________________________________________________             [facility] - Preparing for Surgery Before surgery, you can play an important role.  Because skin is not sterile, your skin needs to be as free of germs as possible.  You can reduce the number of germs on your skin by washing with CHG (chlorahexidine gluconate) soap before surgery.  CHG is an antiseptic cleaner which kills germs and bonds with the skin to continue killing germs even after washing. Please DO NOT use if you have an allergy to CHG or antibacterial soaps.  If your skin becomes reddened/irritated stop using the CHG and inform your nurse when you arrive at Short Stay. Do not shave (including legs and underarms) for at least 48 hours prior to the first CHG shower.  You may shave your face/neck. Please follow these instructions carefully:  1.  Shower with CHG Soap the night before surgery and the  morning of Surgery.  2.  If you choose to  wash your hair, wash your hair first as usual with your  normal  shampoo.  3.  After you shampoo, rinse your hair and body thoroughly to remove the  shampoo.                           4.  Use CHG as you would any other liquid soap.  You can apply chg directly  to the skin and wash                       Gently with a scrungie or clean washcloth.  5.  Apply the CHG Soap to your body ONLY FROM THE NECK DOWN.   Do not use on face/ open  Wound or open sores. Avoid contact with eyes, ears mouth and genitals (private parts).                       Wash face,  Genitals (private parts) with your normal soap.             6.  Wash thoroughly, paying special attention to the area where your surgery  will be performed.  7.  Thoroughly rinse your body with warm water from the neck down.  8.  DO NOT shower/wash with your normal soap after using and rinsing off  the CHG Soap.                9.  Pat yourself dry with a clean towel.            10.  Wear clean pajamas.            11.  Place clean sheets on your bed the night of your first shower and do not  sleep with pets. Day of Surgery : Do not apply any lotions/deodorants the morning of surgery.  Please wear clean clothes to the hospital/surgery center.  FAILURE TO FOLLOW THESE INSTRUCTIONS MAY RESULT IN THE CANCELLATION OF YOUR SURGERY PATIENT SIGNATURE_________________________________  NURSE SIGNATURE__________________________________  ________________________________________________________________________

## 2017-11-26 ENCOUNTER — Encounter (HOSPITAL_COMMUNITY)
Admission: RE | Admit: 2017-11-26 | Discharge: 2017-11-26 | Disposition: A | Discharge status: Home / Self Care | Payer: BLUE CROSS/BLUE SHIELD | Source: Ambulatory Visit | Attending: Surgery | Admitting: Surgery

## 2017-11-26 ENCOUNTER — Ambulatory Visit (HOSPITAL_COMMUNITY)
Admission: RE | Admit: 2017-11-26 | Discharge: 2017-11-26 | Disposition: A | Discharge status: Home / Self Care | Payer: BLUE CROSS/BLUE SHIELD | Source: Ambulatory Visit | Attending: Anesthesiology | Admitting: Anesthesiology

## 2017-11-26 ENCOUNTER — Other Ambulatory Visit: Payer: Self-pay

## 2017-11-26 ENCOUNTER — Encounter (HOSPITAL_COMMUNITY): Payer: Self-pay

## 2017-11-26 DIAGNOSIS — Z01812 Encounter for preprocedural laboratory examination: Secondary | ICD-10-CM | POA: Insufficient documentation

## 2017-11-26 DIAGNOSIS — Z01811 Encounter for preprocedural respiratory examination: Secondary | ICD-10-CM

## 2017-11-26 DIAGNOSIS — D440 Neoplasm of uncertain behavior of thyroid gland: Secondary | ICD-10-CM | POA: Diagnosis not present

## 2017-11-26 DIAGNOSIS — Z01818 Encounter for other preprocedural examination: Secondary | ICD-10-CM | POA: Insufficient documentation

## 2017-11-26 DIAGNOSIS — J439 Emphysema, unspecified: Secondary | ICD-10-CM | POA: Diagnosis not present

## 2017-11-26 HISTORY — DX: Neoplasm of unspecified behavior of endocrine glands and other parts of nervous system: D49.7

## 2017-11-26 HISTORY — DX: Other specified postprocedural states: Z98.890

## 2017-11-26 HISTORY — DX: Nausea with vomiting, unspecified: R11.2

## 2017-11-26 LAB — CBC
HEMATOCRIT: 41.9 % (ref 36.0–46.0)
Hemoglobin: 14.3 g/dL (ref 12.0–15.0)
MCH: 32.1 pg (ref 26.0–34.0)
MCHC: 34.1 g/dL (ref 30.0–36.0)
MCV: 94.2 fL (ref 78.0–100.0)
PLATELETS: 281 10*3/uL (ref 150–400)
RBC: 4.45 MIL/uL (ref 3.87–5.11)
RDW: 12.5 % (ref 11.5–15.5)
WBC: 5.2 10*3/uL (ref 4.0–10.5)

## 2017-12-01 ENCOUNTER — Encounter (HOSPITAL_COMMUNITY): Payer: Self-pay | Admitting: Surgery

## 2017-12-01 DIAGNOSIS — D440 Neoplasm of uncertain behavior of thyroid gland: Secondary | ICD-10-CM | POA: Diagnosis present

## 2017-12-01 NOTE — H&P (Signed)
General Surgery [facility]  Madison [name] DOB: 1960-05-24 Divorced / Language: Cleophus Molt / Race: White Female   History of Present Illness  The patient is a 57 year old female who presents with a thyroid nodule.  CHIEF COMPLAINT: thyroid nodule with atypia  Patient is referred by Dr. Lavone Orn for surgical evaluation and management of dominant left thyroid nodule with cytologic atypia. Patient presented with a nodule on self-examination in early 2019 to her primary care physician. She had noted a mild globus sensation. Patient underwent ultrasound showing a solitary complex nodule measuring 4.1 cm in the left thyroid lobe. Thyroid function was normal with a TSH of 2.03. Patient was referred to endocrinology for evaluation. She underwent fine-needle aspiration which was largely cystic in nature with scant follicular epithelium. Repeat biopsy was performed and samples were submitted for molecular genetic testing. This demonstrated a suspicious lesion with a risk of malignancy of approximately 50%. Patient is referred to surgery for resection for definitive diagnosis and management. Patient has no prior history of head or neck surgery. She has never been on thyroid medication. There is no family history of thyroid cancer or other endocrine neoplasm. Patient denies tremors or palpitations. She denies compressive symptoms. She does have a mild globus sensation. 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   Past Surgical History  Breast Augmentation  Bilateral. Breast Biopsy  Left. Foot Surgery  Left. Hysterectomy (not due to cancer) - Partial   Diagnostic Studies History Colonoscopy  never Mammogram  within last year Pap Smear  1-5 years ago  Allergies No Known Drug Allergies [11/13/2017]: Latex  Rash. Adhesive Tape  Rash. Allergies Reconciled   Medication History Multi Vitamin (Oral)  Active. Cholecalciferol (Oral) Specific strength unknown - Active. Medications Reconciled  Social History Alcohol use  Occasional alcohol use. Caffeine use  Tea. No drug use  Tobacco use  Former smoker.  Family History Alcohol Abuse  Father. Cancer  Father. Diabetes Mellitus  Mother. Heart Disease  Mother. Heart disease in female family member before age 61  Melanoma  Brother, Sister. Migraine Headache  Daughter.  Pregnancy / Birth History Age at menarche  48 years. Age of menopause  [age] Contraceptive History  Oral contraceptives. Gravida  2 Irregular periods  Maternal age  17-30 Para  2  Other Problems No pertinent past medical history   Review of Systems General Not Present- Appetite Loss, Chills, Fatigue, Fever, Night Sweats, Weight Gain and Weight Loss. Skin Not Present- Change in Wart/Mole, Dryness, Hives, Jaundice, New Lesions, Non-Healing Wounds, Rash and Ulcer. HEENT Not Present- Earache, Hearing Loss, Hoarseness, Nose Bleed, Oral Ulcers, Ringing in the Ears, Seasonal Allergies, Sinus Pain, Sore Throat, Visual Disturbances, Wears glasses/contact lenses and Yellow Eyes. Respiratory Not Present- Bloody sputum, Chronic Cough, Difficulty Breathing, Snoring and Wheezing. Breast Not Present- Breast Mass, Breast Pain, Nipple Discharge and Skin Changes. Cardiovascular Not Present- Chest Pain, Difficulty Breathing Lying Down, Leg Cramps, Palpitations, Rapid Heart Rate, Shortness of Breath and Swelling of Extremities. Gastrointestinal Not Present- Abdominal Pain, Bloating, Bloody Stool, Change in Bowel Habits, Chronic diarrhea, Constipation, Difficulty Swallowing, Excessive gas, Gets full quickly at meals, Hemorrhoids, Indigestion, Nausea, Rectal Pain and Vomiting. Female Genitourinary Not Present- Frequency, Nocturia, Painful Urination, Pelvic Pain and Urgency. Musculoskeletal Not Present- Back Pain, Joint Pain, Joint Stiffness, Muscle Pain, Muscle  Weakness and Swelling of Extremities. Neurological Not Present- Decreased Memory, Fainting, Headaches, Numbness, Seizures, Tingling, Tremor, Trouble walking and  Weakness. Psychiatric Not Present- Anxiety, Bipolar, Change in Sleep Pattern, Depression, Fearful and Frequent crying. Endocrine Present- Hot flashes. Not Present- Cold Intolerance, Excessive Hunger, Hair Changes, Heat Intolerance and New Diabetes. Hematology Not Present- Blood Thinners, Easy Bruising, Excessive bleeding, Gland problems, HIV and Persistent Infections.  Vitals Weight: 164.4 lb Height: 69in Height was reported by patient. Body Surface Area: 1.9 m Body Mass Index: 24.28 kg/m  Temp.: 110F(Temporal)  Pulse: 71 (Regular)  P.OX: 100% (Room air) BP: 136/70 (Sitting, Left Arm, Standard)  Physical Exam  See vital signs recorded above  GENERAL APPEARANCE Development: normal Nutritional status: normal Gross deformities: none  SKIN Rash, lesions, ulcers: none Induration, erythema: none Nodules: none palpable  EYES Conjunctiva and lids: normal Pupils: equal and reactive Iris: normal bilaterally  EARS, NOSE, MOUTH, THROAT External ears: no lesion or deformity External nose: no lesion or deformity Hearing: grossly normal Lips: no lesion or deformity Dentition: normal for age Oral mucosa: moist  NECK Symmetric: no Trachea: midline Thyroid: There is a dominant mass occupying the left thyroid lobe. This measures at least 4 cm in size, is relatively firm, nontender, and mobile with swallowing. Right thyroid lobe is without palpable abnormality. There is no associated lymphadenopathy.  CHEST Respiratory effort: normal Retraction or accessory muscle use: no Breath sounds: normal bilaterally Rales, rhonchi, wheeze: none  CARDIOVASCULAR Auscultation: regular rhythm, normal rate Murmurs: none Pulses: carotid and radial pulse 2+ palpable Lower extremity edema: none Lower extremity varicosities:  none  MUSCULOSKELETAL Station and gait: normal Digits and nails: no clubbing or cyanosis Muscle strength: grossly normal all extremities Range of motion: grossly normal all extremities Deformity: none  LYMPHATIC Cervical: none palpable Supraclavicular: none palpable  PSYCHIATRIC Oriented to person, place, and time: yes Mood and affect: normal for situation Judgment and insight: appropriate for situation    Assessment & Plan  NEOPLASM OF UNCERTAIN BEHAVIOR OF THYROID GLAND (D44.0)  Pt Education - Pamphlet Given - The Thyroid Book: discussed with patient and provided information.  LEFT THYROID NODULE (E04.1)  Patient presents on referral from her endocrinologist for evaluation of a dominant left thyroid nodule measuring 4.1 cm in size and showing significant cytologic atypia. Molecular genetic testing is suspicious for malignancy, giving her a risk of cancer of approximately 50%. Patient is provided with written literature on thyroid surgery to review at home.  The patient and I reviewed her clinical history, her diagnostic studies, and her biopsy results. We discussed the options for management. Patient could either undergo left thyroid lobectomy or total thyroidectomy. I have recommended left thyroid lobectomy for definitive diagnosis. If this turns out to be a benign nodule, then this will be sufficient treatment. There is a risk however of need for additional surgery for completion thyroidectomy if the tumor is malignant. Also, if radioactive iodine treatment is indicated, the patient would require completion thyroidectomy. We discussed the pros and cons of each procedure. We discussed the risk and benefits of the procedure including the risk of injury to recurrent laryngeal nerve and risk of injury to parathyroid glands. We discussed the hospital stay to be anticipated. We discussed the location and appearance of the surgical incision. We discussed her recovery and  return to work. We discussed the potential need for radioactive iodine treatment. We discussed the potential need for lifelong thyroid hormone supplementation. Patient understands and wishes to proceed with surgery in the near future.  The risks and benefits of the procedure have been discussed at length with the patient. The patient understands the  proposed procedure, potential alternative treatments, and the course of recovery to be expected. All of the patient's questions have been answered at this time. The patient wishes to proceed with surgery.  Armandina Gemma, [facility] Office: [phone]

## 2017-12-01 NOTE — H&P (View-Only) (Signed)
General Surgery [facility]  Madison [name] DOB: [birth year] Divorced / Language: Cleophus Molt / Race: White Female   History of Present Illness  The patient is a 57 year old female who presents with a thyroid nodule.  CHIEF COMPLAINT: thyroid nodule with atypia  Patient is referred by Dr. Lavone Orn for surgical evaluation and management of dominant left thyroid nodule with cytologic atypia. Patient presented with a nodule on self-examination in early 2019 to her primary care physician. She had noted a mild globus sensation. Patient underwent ultrasound showing a solitary complex nodule measuring 4.1 cm in the left thyroid lobe. Thyroid function was normal with a TSH of 2.03. Patient was referred to endocrinology for evaluation. She underwent fine-needle aspiration which was largely cystic in nature with scant follicular epithelium. Repeat biopsy was performed and samples were submitted for molecular genetic testing. This demonstrated a suspicious lesion with a risk of malignancy of approximately 50%. Patient is referred to surgery for resection for definitive diagnosis and management. Patient has no prior history of head or neck surgery. She has never been on thyroid medication. There is no family history of thyroid cancer or other endocrine neoplasm. Patient denies tremors or palpitations. She denies compressive symptoms. She does have a mild globus sensation. 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   Past Surgical History  Breast Augmentation  Bilateral. Breast Biopsy  Left. Foot Surgery  Left. Hysterectomy (not due to cancer) - Partial   Diagnostic Studies History Colonoscopy  never Mammogram  within last year Pap Smear  1-5 years ago  Allergies No Known Drug Allergies [11/13/2017]: Latex  Rash. Adhesive Tape  Rash. Allergies Reconciled   Medication History Multi Vitamin (Oral)  Active. Cholecalciferol (Oral) Specific strength unknown - Active. Medications Reconciled  Social History Alcohol use  Occasional alcohol use. Caffeine use  Tea. No drug use  Tobacco use  Former smoker.  Family History Alcohol Abuse  Father. Cancer  Father. Diabetes Mellitus  Mother. Heart Disease  Mother. Heart disease in female family member before age 90  Melanoma  Brother, Sister. Migraine Headache  Daughter.  Pregnancy / Birth History Age at menarche  62 years. Age of menopause  53-58 Contraceptive History  Oral contraceptives. Gravida  2 Irregular periods  Maternal age  26-30 Para  2  Other Problems No pertinent past medical history   Review of Systems General Not Present- Appetite Loss, Chills, Fatigue, Fever, Night Sweats, Weight Gain and Weight Loss. Skin Not Present- Change in Wart/Mole, Dryness, Hives, Jaundice, New Lesions, Non-Healing Wounds, Rash and Ulcer. HEENT Not Present- Earache, Hearing Loss, Hoarseness, Nose Bleed, Oral Ulcers, Ringing in the Ears, Seasonal Allergies, Sinus Pain, Sore Throat, Visual Disturbances, Wears glasses/contact lenses and Yellow Eyes. Respiratory Not Present- Bloody sputum, Chronic Cough, Difficulty Breathing, Snoring and Wheezing. Breast Not Present- Breast Mass, Breast Pain, Nipple Discharge and Skin Changes. Cardiovascular Not Present- Chest Pain, Difficulty Breathing Lying Down, Leg Cramps, Palpitations, Rapid Heart Rate, Shortness of Breath and Swelling of Extremities. Gastrointestinal Not Present- Abdominal Pain, Bloating, Bloody Stool, Change in Bowel Habits, Chronic diarrhea, Constipation, Difficulty Swallowing, Excessive gas, Gets full quickly at meals, Hemorrhoids, Indigestion, Nausea, Rectal Pain and Vomiting. Female Genitourinary Not Present- Frequency, Nocturia, Painful Urination, Pelvic Pain and Urgency. Musculoskeletal Not Present- Back Pain, Joint Pain, Joint Stiffness, Muscle Pain, Muscle  Weakness and Swelling of Extremities. Neurological Not Present- Decreased Memory, Fainting, Headaches, Numbness, Seizures, Tingling, Tremor, Trouble walking and  Weakness. Psychiatric Not Present- Anxiety, Bipolar, Change in Sleep Pattern, Depression, Fearful and Frequent crying. Endocrine Present- Hot flashes. Not Present- Cold Intolerance, Excessive Hunger, Hair Changes, Heat Intolerance and New Diabetes. Hematology Not Present- Blood Thinners, Easy Bruising, Excessive bleeding, Gland problems, HIV and Persistent Infections.  Vitals Weight: 164.4 lb Height: 69in Height was reported by patient. Body Surface Area: 1.9 m Body Mass Index: 24.28 kg/m  Temp.: 28F(Temporal)  Pulse: 71 (Regular)  P.OX: 100% (Room air) BP: 136/70 (Sitting, Left Arm, Standard)  Physical Exam  See vital signs recorded above  GENERAL APPEARANCE Development: normal Nutritional status: normal Gross deformities: none  SKIN Rash, lesions, ulcers: none Induration, erythema: none Nodules: none palpable  EYES Conjunctiva and lids: normal Pupils: equal and reactive Iris: normal bilaterally  EARS, NOSE, MOUTH, THROAT External ears: no lesion or deformity External nose: no lesion or deformity Hearing: grossly normal Lips: no lesion or deformity Dentition: normal for age Oral mucosa: moist  NECK Symmetric: no Trachea: midline Thyroid: There is a dominant mass occupying the left thyroid lobe. This measures at least 4 cm in size, is relatively firm, nontender, and mobile with swallowing. Right thyroid lobe is without palpable abnormality. There is no associated lymphadenopathy.  CHEST Respiratory effort: normal Retraction or accessory muscle use: no Breath sounds: normal bilaterally Rales, rhonchi, wheeze: none  CARDIOVASCULAR Auscultation: regular rhythm, normal rate Murmurs: none Pulses: carotid and radial pulse 2+ palpable Lower extremity edema: none Lower extremity varicosities:  none  MUSCULOSKELETAL Station and gait: normal Digits and nails: no clubbing or cyanosis Muscle strength: grossly normal all extremities Range of motion: grossly normal all extremities Deformity: none  LYMPHATIC Cervical: none palpable Supraclavicular: none palpable  PSYCHIATRIC Oriented to person, place, and time: yes Mood and affect: normal for situation Judgment and insight: appropriate for situation    Assessment & Plan  NEOPLASM OF UNCERTAIN BEHAVIOR OF THYROID GLAND (D44.0)  Pt Education - Pamphlet Given - The Thyroid Book: discussed with patient and provided information.  LEFT THYROID NODULE (E04.1)  Patient presents on referral from her endocrinologist for evaluation of a dominant left thyroid nodule measuring 4.1 cm in size and showing significant cytologic atypia. Molecular genetic testing is suspicious for malignancy, giving her a risk of cancer of approximately 50%. Patient is provided with written literature on thyroid surgery to review at home.  The patient and I reviewed her clinical history, her diagnostic studies, and her biopsy results. We discussed the options for management. Patient could either undergo left thyroid lobectomy or total thyroidectomy. I have recommended left thyroid lobectomy for definitive diagnosis. If this turns out to be a benign nodule, then this will be sufficient treatment. There is a risk however of need for additional surgery for completion thyroidectomy if the tumor is malignant. Also, if radioactive iodine treatment is indicated, the patient would require completion thyroidectomy. We discussed the pros and cons of each procedure. We discussed the risk and benefits of the procedure including the risk of injury to recurrent laryngeal nerve and risk of injury to parathyroid glands. We discussed the hospital stay to be anticipated. We discussed the location and appearance of the surgical incision. We discussed her recovery and  return to work. We discussed the potential need for radioactive iodine treatment. We discussed the potential need for lifelong thyroid hormone supplementation. Patient understands and wishes to proceed with surgery in the near future.  The risks and benefits of the procedure have been discussed at length with the patient. The patient understands the  proposed procedure, potential alternative treatments, and the course of recovery to be expected. All of the patient's questions have been answered at this time. The patient wishes to proceed with surgery.  Armandina Gemma, [place] [facility] Office: [phone]

## 2017-12-02 ENCOUNTER — Ambulatory Visit (HOSPITAL_COMMUNITY): Payer: BLUE CROSS/BLUE SHIELD | Admitting: Certified Registered Nurse Anesthetist

## 2017-12-02 ENCOUNTER — Observation Stay (HOSPITAL_COMMUNITY)
Admission: RE | Admit: 2017-12-02 | Discharge: 2017-12-03 | Disposition: A | Discharge status: Home / Self Care | Payer: BLUE CROSS/BLUE SHIELD | Source: Ambulatory Visit | Attending: Surgery | Admitting: Surgery

## 2017-12-02 ENCOUNTER — Encounter (HOSPITAL_COMMUNITY): Payer: Self-pay

## 2017-12-02 ENCOUNTER — Encounter (HOSPITAL_COMMUNITY)
Admission: RE | Disposition: A | Discharge status: Home / Self Care | Payer: Self-pay | Source: Ambulatory Visit | Attending: Surgery

## 2017-12-02 ENCOUNTER — Other Ambulatory Visit: Payer: Self-pay

## 2017-12-02 DIAGNOSIS — C73 Malignant neoplasm of thyroid gland: Secondary | ICD-10-CM | POA: Diagnosis not present

## 2017-12-02 DIAGNOSIS — F458 Other somatoform disorders: Secondary | ICD-10-CM | POA: Insufficient documentation

## 2017-12-02 DIAGNOSIS — E049 Nontoxic goiter, unspecified: Secondary | ICD-10-CM | POA: Diagnosis present

## 2017-12-02 DIAGNOSIS — Z87891 Personal history of nicotine dependence: Secondary | ICD-10-CM | POA: Insufficient documentation

## 2017-12-02 DIAGNOSIS — E079 Disorder of thyroid, unspecified: Secondary | ICD-10-CM | POA: Diagnosis present

## 2017-12-02 DIAGNOSIS — D440 Neoplasm of uncertain behavior of thyroid gland: Secondary | ICD-10-CM | POA: Diagnosis present

## 2017-12-02 DIAGNOSIS — E041 Nontoxic single thyroid nodule: Secondary | ICD-10-CM | POA: Diagnosis present

## 2017-12-02 HISTORY — PX: THYROID LOBECTOMY: SHX420

## 2017-12-02 SURGERY — LOBECTOMY, THYROID
Anesthesia: General | Laterality: Left

## 2017-12-02 MED ORDER — LIDOCAINE 2% (20 MG/ML) 5 ML SYRINGE
INTRAMUSCULAR | Status: DC | PRN
  Administered 2017-12-02: 60 mg via INTRAVENOUS

## 2017-12-02 MED ORDER — CEFAZOLIN SODIUM-DEXTROSE 2-4 GM/100ML-% IV SOLN
2.0000 g | INTRAVENOUS | Status: AC
  Administered 2017-12-02: 2 g via INTRAVENOUS
  Filled 2017-12-02: qty 100

## 2017-12-02 MED ORDER — 0.9 % SODIUM CHLORIDE (POUR BTL) OPTIME
TOPICAL | Status: DC | PRN
  Administered 2017-12-02: 1000 mL

## 2017-12-02 MED ORDER — LACTATED RINGERS IV SOLN
INTRAVENOUS | Status: DC
  Administered 2017-12-02: 08:00:00 via INTRAVENOUS

## 2017-12-02 MED ORDER — ACETAMINOPHEN 650 MG RE SUPP
650.0000 mg | Freq: Four times a day (QID) | RECTAL | Status: DC | PRN
  Filled 2017-12-02: qty 1

## 2017-12-02 MED ORDER — FENTANYL CITRATE (PF) 250 MCG/5ML IJ SOLN
INTRAMUSCULAR | Status: DC | PRN
  Administered 2017-12-02: 50 ug via INTRAVENOUS
  Administered 2017-12-02: 100 ug via INTRAVENOUS
  Administered 2017-12-02: 50 ug via INTRAVENOUS

## 2017-12-02 MED ORDER — ROCURONIUM BROMIDE 10 MG/ML (PF) SYRINGE
PREFILLED_SYRINGE | INTRAVENOUS | Status: DC | PRN
  Administered 2017-12-02: 50 mg via INTRAVENOUS

## 2017-12-02 MED ORDER — ROCURONIUM BROMIDE 10 MG/ML (PF) SYRINGE
PREFILLED_SYRINGE | INTRAVENOUS | Status: AC
  Filled 2017-12-02: qty 10

## 2017-12-02 MED ORDER — HYDROMORPHONE HCL 1 MG/ML IJ SOLN: 0.2500 mg | INTRAMUSCULAR | Status: DC | PRN

## 2017-12-02 MED ORDER — LIDOCAINE 2% (20 MG/ML) 5 ML SYRINGE
INTRAMUSCULAR | Status: AC
  Filled 2017-12-02: qty 5

## 2017-12-02 MED ORDER — HYDROCODONE-ACETAMINOPHEN 5-325 MG PO TABS
1.0000 | ORAL_TABLET | ORAL | Status: DC | PRN
  Administered 2017-12-02 – 2017-12-03 (×3): 2 via ORAL
  Filled 2017-12-02 (×3): qty 2

## 2017-12-02 MED ORDER — SUGAMMADEX SODIUM 500 MG/5ML IV SOLN
INTRAVENOUS | Status: AC
  Filled 2017-12-02: qty 5

## 2017-12-02 MED ORDER — HYDROMORPHONE HCL 1 MG/ML IJ SOLN: 1.0000 mg | INTRAMUSCULAR | Status: DC | PRN

## 2017-12-02 MED ORDER — MIDAZOLAM HCL 5 MG/5ML IJ SOLN
INTRAMUSCULAR | Status: DC | PRN
  Administered 2017-12-02: 2 mg via INTRAVENOUS

## 2017-12-02 MED ORDER — PROPOFOL 10 MG/ML IV BOLUS
INTRAVENOUS | Status: AC
  Filled 2017-12-02: qty 20

## 2017-12-02 MED ORDER — SUGAMMADEX SODIUM 200 MG/2ML IV SOLN
INTRAVENOUS | Status: DC | PRN
  Administered 2017-12-02: 200 mg via INTRAVENOUS

## 2017-12-02 MED ORDER — SUGAMMADEX SODIUM 200 MG/2ML IV SOLN
INTRAVENOUS | Status: AC
  Filled 2017-12-02: qty 2

## 2017-12-02 MED ORDER — KCL IN DEXTROSE-NACL 20-5-0.45 MEQ/L-%-% IV SOLN
INTRAVENOUS | Status: DC
  Administered 2017-12-02: 12:00:00 via INTRAVENOUS
  Filled 2017-12-02 (×2): qty 1000

## 2017-12-02 MED ORDER — ONDANSETRON HCL 4 MG/2ML IJ SOLN
INTRAMUSCULAR | Status: AC
  Filled 2017-12-02: qty 2

## 2017-12-02 MED ORDER — ONDANSETRON 4 MG PO TBDP: 4.0000 mg | ORAL_TABLET | Freq: Four times a day (QID) | ORAL | Status: DC | PRN

## 2017-12-02 MED ORDER — FENTANYL CITRATE (PF) 100 MCG/2ML IJ SOLN
INTRAMUSCULAR | Status: AC
  Filled 2017-12-02: qty 2

## 2017-12-02 MED ORDER — DIPHENHYDRAMINE HCL 50 MG/ML IJ SOLN
INTRAMUSCULAR | Status: DC | PRN
  Administered 2017-12-02: 12.5 mg via INTRAVENOUS

## 2017-12-02 MED ORDER — DEXAMETHASONE SODIUM PHOSPHATE 10 MG/ML IJ SOLN
INTRAMUSCULAR | Status: AC
  Filled 2017-12-02: qty 1

## 2017-12-02 MED ORDER — ONDANSETRON HCL 4 MG/2ML IJ SOLN
INTRAMUSCULAR | Status: DC | PRN
  Administered 2017-12-02: 4 mg via INTRAVENOUS

## 2017-12-02 MED ORDER — ONDANSETRON HCL 4 MG/2ML IJ SOLN: 4.0000 mg | Freq: Four times a day (QID) | INTRAMUSCULAR | Status: DC | PRN

## 2017-12-02 MED ORDER — TRAMADOL HCL 50 MG PO TABS: 50.0000 mg | ORAL_TABLET | Freq: Four times a day (QID) | ORAL | Status: DC | PRN

## 2017-12-02 MED ORDER — PROPOFOL 10 MG/ML IV BOLUS
INTRAVENOUS | Status: DC | PRN
  Administered 2017-12-02: 150 mg via INTRAVENOUS

## 2017-12-02 MED ORDER — CHLORHEXIDINE GLUCONATE CLOTH 2 % EX PADS: 6.0000 | MEDICATED_PAD | Freq: Once | CUTANEOUS | Status: DC

## 2017-12-02 MED ORDER — MIDAZOLAM HCL 2 MG/2ML IJ SOLN
INTRAMUSCULAR | Status: AC
  Filled 2017-12-02: qty 2

## 2017-12-02 MED ORDER — ACETAMINOPHEN 325 MG PO TABS: 650.0000 mg | ORAL_TABLET | Freq: Four times a day (QID) | ORAL | Status: DC | PRN

## 2017-12-02 MED ORDER — DEXAMETHASONE SODIUM PHOSPHATE 10 MG/ML IJ SOLN
INTRAMUSCULAR | Status: DC | PRN
  Administered 2017-12-02: 10 mg via INTRAVENOUS

## 2017-12-02 SURGICAL SUPPLY — 39 items
ATTRACTOMAT 16X20 MAGNETIC DRP (DRAPES) ×2 IMPLANT
BLADE SURG 15 STRL LF DISP TIS (BLADE) ×1 IMPLANT
BLADE SURG 15 STRL SS (BLADE) ×1
CHLORAPREP W/TINT 26ML (MISCELLANEOUS) ×4 IMPLANT
CLIP VESOCCLUDE MED 6/CT (CLIP) ×4 IMPLANT
CLIP VESOCCLUDE SM WIDE 6/CT (CLIP) ×4 IMPLANT
COVER SURGICAL LIGHT HANDLE (MISCELLANEOUS) ×2 IMPLANT
DISSECTOR ROUND CHERRY 3/8 STR (MISCELLANEOUS) IMPLANT
DRAPE LAPAROTOMY T 98X78 PEDS (DRAPES) ×2 IMPLANT
ELECT PENCIL ROCKER SW 15FT (MISCELLANEOUS) ×2 IMPLANT
ELECT REM PT RETURN 15FT ADLT (MISCELLANEOUS) ×2 IMPLANT
GAUZE 4X4 16PLY RFD (DISPOSABLE) ×2 IMPLANT
GAUZE SPONGE 4X4 12PLY STRL (GAUZE/BANDAGES/DRESSINGS) ×2 IMPLANT
GLOVE BIO SURGEON STRL SZ 6.5 (GLOVE) ×2 IMPLANT
GLOVE BIO SURGEON STRL SZ7 (GLOVE) ×2 IMPLANT
GLOVE BIOGEL PI IND STRL 6.5 (GLOVE) ×1 IMPLANT
GLOVE BIOGEL PI IND STRL 7.0 (GLOVE) ×1 IMPLANT
GLOVE BIOGEL PI INDICATOR 6.5 (GLOVE) ×1
GLOVE BIOGEL PI INDICATOR 7.0 (GLOVE) ×1
GLOVE SURG ORTHO 8.0 STRL STRW (GLOVE) ×2 IMPLANT
GOWN STRL REUS W/TWL XL LVL3 (GOWN DISPOSABLE) ×4 IMPLANT
HEMOSTAT SURGICEL 2X4 FIBR (HEMOSTASIS) IMPLANT
ILLUMINATOR WAVEGUIDE N/F (MISCELLANEOUS) ×2 IMPLANT
KIT BASIN OR (CUSTOM PROCEDURE TRAY) ×2 IMPLANT
LIGHT WAVEGUIDE WIDE FLAT (MISCELLANEOUS) IMPLANT
PACK BASIC VI WITH GOWN DISP (CUSTOM PROCEDURE TRAY) ×2 IMPLANT
POWDER SURGICEL 3.0 GRAM (HEMOSTASIS) IMPLANT
SHEARS HARMONIC 9CM CVD (BLADE) ×2 IMPLANT
STRIP CLOSURE SKIN 1/2X4 (GAUZE/BANDAGES/DRESSINGS) ×2 IMPLANT
SUT MNCRL AB 4-0 PS2 18 (SUTURE) ×2 IMPLANT
SUT SILK 2 0 (SUTURE)
SUT SILK 2-0 18XBRD TIE 12 (SUTURE) IMPLANT
SUT SILK 3 0 (SUTURE)
SUT SILK 3-0 18XBRD TIE 12 (SUTURE) IMPLANT
SUT VIC AB 3-0 SH 18 (SUTURE) ×4 IMPLANT
SYR BULB IRRIGATION 50ML (SYRINGE) ×2 IMPLANT
TOWEL OR 17X26 10 PK STRL BLUE (TOWEL DISPOSABLE) ×2 IMPLANT
TOWEL OR NON WOVEN STRL DISP B (DISPOSABLE) ×2 IMPLANT
YANKAUER SUCT BULB TIP 10FT TU (MISCELLANEOUS) ×2 IMPLANT

## 2017-12-02 NOTE — Anesthesia Preprocedure Evaluation (Addendum)
Anesthesia Evaluation  Patient identified by MRN, date of birth, ID band Patient awake    Reviewed: Allergy & Precautions, H&P , NPO status , Patient's Chart, lab work & pertinent test results  History of Anesthesia Complications (+) PONV  Airway Mallampati: III  TM Distance: >3 FB Neck ROM: Full    Dental no notable dental hx. (+) Teeth Intact, Dental Advisory Given   Pulmonary neg pulmonary ROS, former smoker,    Pulmonary exam normal breath sounds clear to auscultation       Cardiovascular negative cardio ROS   Rhythm:Regular Rate:Normal     Neuro/Psych negative neurological ROS  negative psych ROS   GI/Hepatic negative GI ROS, Neg liver ROS,   Endo/Other  negative endocrine ROS  Renal/GU negative Renal ROS  negative genitourinary   Musculoskeletal   Abdominal   Peds  Hematology negative hematology ROS (+)   Anesthesia Other Findings   Reproductive/Obstetrics negative OB ROS                            Anesthesia Physical Anesthesia Plan  ASA: II  Anesthesia Plan: General   Post-op Pain Management:    Induction: Intravenous  PONV Risk Score and Plan: 4 or greater and Ondansetron, Dexamethasone and Midazolam  Airway Management Planned: Oral ETT  Additional Equipment:   Intra-op Plan:   Post-operative Plan: Extubation in OR  Informed Consent: I have reviewed the patients History and Physical, chart, labs and discussed the procedure including the risks, benefits and alternatives for the proposed anesthesia with the patient or authorized representative who has indicated his/her understanding and acceptance.   Dental advisory given  Plan Discussed with: CRNA  Anesthesia Plan Comments:         Anesthesia Quick Evaluation

## 2017-12-02 NOTE — Interval H&P Note (Signed)
History and Physical Interval Note:  12/02/2017 8:54 AM  Madison [name]  has presented today for surgery, with the diagnosis of thyroid neoplasm of uncertain behavior.  The various methods of treatment have been discussed with the patient and family. After consideration of risks, benefits and other options for treatment, the patient has consented to    Procedure(s): LEFT THYROID LOBECTOMY (Left) as a surgical intervention .    The patient's history has been reviewed, patient examined, no change in status, stable for surgery.  I have reviewed the patient's chart and labs.  Questions were answered to the patient's satisfaction.    Armandina Gemma, [facility] Office: [place]

## 2017-12-02 NOTE — Anesthesia Postprocedure Evaluation (Signed)
Anesthesia Post Note  Patient: Madison [name]  Procedure(s) Performed: LEFT THYROID LOBECTOMY (Left )     Patient location during evaluation: PACU Anesthesia Type: General Level of consciousness: awake and alert Pain management: pain level controlled Vital Signs Assessment: post-procedure vital signs reviewed and stable Respiratory status: spontaneous breathing, nonlabored ventilation, respiratory function stable and patient connected to nasal cannula oxygen Cardiovascular status: blood pressure returned to baseline and stable Postop Assessment: no apparent nausea or vomiting Anesthetic complications: no    Last Vitals:  Vitals:   12/02/17 1115 12/02/17 1130  BP: (!) 143/76 (!) 149/78  Pulse: (!) 59 (!) 44  Resp: 10 11  Temp:  36.6 C  SpO2: 99% 100%    Last Pain:  Vitals:   12/02/17 1130  TempSrc:   PainSc: 0-No pain                 Bracken Moffa,W. EDMOND

## 2017-12-02 NOTE — Transfer of Care (Signed)
Immediate Anesthesia Transfer of Care Note  Patient: Madison [name]  Procedure(s) Performed: LEFT THYROID LOBECTOMY (Left )  Patient Location: PACU  Anesthesia Type:General  Level of Consciousness: awake, alert , oriented and patient cooperative  Airway & Oxygen Therapy: Patient Spontanous Breathing and Patient connected to face mask oxygen  Post-op Assessment: Report given to RN, Post -op Vital signs reviewed and stable and Patient moving all extremities  Post vital signs: Reviewed and stable  Last Vitals:  Vitals Value Taken Time  BP 137/80 12/02/2017 10:52 AM  Temp 36.5 C 12/02/2017 10:52 AM  Pulse 63 12/02/2017 10:56 AM  Resp 5 12/02/2017 10:56 AM  SpO2 100 % 12/02/2017 10:56 AM  Vitals shown include unvalidated device data.  Last Pain:  Vitals:   12/02/17 0756  TempSrc:   PainSc: 0-No pain         Complications: No apparent anesthesia complications

## 2017-12-02 NOTE — Op Note (Signed)
Procedure Note  Pre-operative Diagnosis:  Thyroid neoplasm of uncertain behavior  Post-operative Diagnosis:  same  Surgeon:  Armandina Gemma, MD  Assistant:  none   Procedure:  Left thyroid lobectomy  Anesthesia:  General  Estimated Blood Loss:  minimal  Drains: none         Specimen: thyroid lobe to pathology  Indications:  Patient is referred by Dr. Lavone Orn for surgical evaluation and management of dominant left thyroid nodule with cytologic atypia. Patient presented with a nodule on self-examination in early 2019 to her primary care physician. She had noted a mild globus sensation. Patient underwent ultrasound showing a solitary complex nodule measuring 4.1 cm in the left thyroid lobe. Thyroid function was normal with a TSH of 2.03. Patient was referred to endocrinology for evaluation. She underwent fine-needle aspiration which was largely cystic in nature with scant follicular epithelium. Repeat biopsy was performed and samples were submitted for molecular genetic testing. This demonstrated a suspicious lesion with a risk of malignancy of approximately 50%. Patient is referred to surgery for resection for definitive diagnosis and management.  Procedure Details: Procedure was done in OR #8 at the [facility].  The patient was brought to the operating room and placed in a supine position on the operating room table.  Following administration of general anesthesia, the patient was positioned and then prepped and draped in the usual aseptic fashion.  After ascertaining that an adequate level of anesthesia had been achieved, a Kocher incision was made with #15 blade.  Dissection was carried through subcutaneous tissues and platysma. Hemostasis was achieved with the electrocautery.  Skin flaps were elevated cephalad and caudad from the thyroid notch to the sternal notch.  A self-retaining retractor was placed for exposure.  Strap muscles were incised in the midline and  dissection was begun on the left side.  Strap muscles were reflected laterally.  The left thyroid lobe was moderately enlarged with a central dominant relatively firm mass.  The lobe was gently mobilized with blunt dissection.  Superior pole vessels were dissected out and divided individually between small and medium Ligaclips with the Harmonic scalpel.  The thyroid lobe was rolled anteriorly.  Branches of the inferior thyroid artery were divided between small Ligaclips with the Harmonic scalpel.  Inferior venous tributaries were divided between Ligaclips.  Both the superior and inferior parathyroid glands were identified and preserved on their vascular pedicles.  The recurrent laryngeal nerve was identified and preserved along its course.  The ligament of Gwenlyn Found was released with the electrocautery and the gland was mobilized onto the anterior trachea. Isthmus was mobilized across the midline.  There was no pyramidal lobe present.  The thyroid parenchyma was transected at the junction of the isthmus and contralateral thyroid lobe with the Harmonic scalpel.  The thyroid lobe and isthmus were submitted to pathology for review.  The neck was irrigated with warm saline.  Fibrillar was placed throughout the operative field.  Strap muscles were reapproximated in the midline with interrupted 3-0 Vicryl sutures.  Platysma was closed with interrupted 3-0 Vicryl sutures.  Skin was closed with a running 4-0 Monocryl subcuticular suture.  Wound was washed and dried and steri-strips were applied.  Dry gauze dressing was placed.  The patient was awakened from anesthesia and brought to the recovery room.  The patient tolerated the procedure well.   Armandina Gemma, MD [facility] Office: [phone]

## 2017-12-02 NOTE — Anesthesia Procedure Notes (Signed)
Procedure Name: Intubation Date/Time: 12/02/2017 9:35 AM Performed by: Mitzie Na, CRNA Pre-anesthesia Checklist: Patient identified, Emergency Drugs available, Patient being monitored, Suction available and Timeout performed Patient Re-evaluated:Patient Re-evaluated prior to induction Oxygen Delivery Method: Circle system utilized Preoxygenation: Pre-oxygenation with 100% oxygen Induction Type: IV induction Ventilation: Mask ventilation without difficulty Laryngoscope Size: Mac and 3 Grade View: Grade I Tube type: Oral Tube size: 7.0 mm Number of attempts: 1 Airway Equipment and Method: Stylet Placement Confirmation: ETT inserted through vocal cords under direct vision,  positive ETCO2 and breath sounds checked- equal and bilateral Secured at: 22 cm Tube secured with: Tape Dental Injury: Teeth and Oropharynx as per pre-operative assessment

## 2017-12-03 ENCOUNTER — Encounter (HOSPITAL_COMMUNITY): Payer: Self-pay | Admitting: Surgery

## 2017-12-03 DIAGNOSIS — F458 Other somatoform disorders: Secondary | ICD-10-CM | POA: Diagnosis not present

## 2017-12-03 DIAGNOSIS — C73 Malignant neoplasm of thyroid gland: Secondary | ICD-10-CM | POA: Diagnosis not present

## 2017-12-03 DIAGNOSIS — Z23 Encounter for immunization: Secondary | ICD-10-CM | POA: Diagnosis not present

## 2017-12-03 DIAGNOSIS — Z87891 Personal history of nicotine dependence: Secondary | ICD-10-CM | POA: Diagnosis not present

## 2017-12-03 MED ORDER — TRAMADOL HCL 50 MG PO TABS: 50.0000 mg | ORAL_TABLET | Freq: Four times a day (QID) | ORAL | 0 refills | Status: DC | PRN

## 2017-12-03 NOTE — Progress Notes (Signed)
Discharge instructions given to pt and all questions were answered. Pt taken down via wheelchair and was picked up by her sister. 

## 2017-12-03 NOTE — Discharge Summary (Signed)
Physician Discharge Summary [facility]  Patient ID: Madison [name] MRN: [id number] DOB/AGE: [birth year] 57 y.o.  Admit date: 12/02/2017 Discharge date: 12/03/2017  Admission Diagnoses:  Thyroid neoplasm of uncertain behavior  Discharge Diagnoses:  Principal Problem:   Neoplasm of uncertain behavior of thyroid gland Active Problems:   Thyroid goiter   Thyroid nodule   Thyroid mass   Discharged Condition: good  Hospital Course: Patient was admitted for observation following thyroid surgery.  Post op course was uncomplicated.  Pain was well controlled.  Tolerated diet.  Patient was prepared for discharge home on POD#1.  Consults: None  Treatments: surgery: left thyroid lobectomy  Discharge Exam: Blood pressure 110/60, pulse (!) 45, temperature 97.9 F (36.6 C), temperature source Oral, resp. rate 16, height 5\' 9"  (1.753 m), weight 74.8 kg, SpO2 99 %. HEENT - clear Neck - wound dry and intact; mild STS; voice normal Chest - clear bilaterally Cor - RRR  Disposition: Home  Discharge Instructions    Diet - low sodium heart healthy   Complete by:  As directed    Discharge instructions   Complete by:  As directed    [facility]  THYROID & PARATHYROID SURGERY:  POST-OP INSTRUCTIONS  Always review your discharge instruction sheet from the facility where your surgery was performed.  A prescription for pain medication may be given to you upon discharge.  Take your pain medication as prescribed.  If narcotic pain medicine is not needed, then you may take acetaminophen (Tylenol) or ibuprofen (Advil) as needed.  Take your usually prescribed medications unless otherwise directed.  If you need a refill on your pain medication, please contact our office during regular business hours.  Prescriptions cannot be processed by our office after 5 pm or on weekends.  Start with a light diet upon arrival home, such as soup and crackers or toast.  Be  sure to drink plenty of fluids daily.  Resume your normal diet the day after surgery.  Most patients will experience some swelling and bruising on the chest and neck area.  Ice packs will help.  Swelling and bruising can take several days to resolve.   It is common to experience some constipation after surgery.  Increasing fluid intake and taking a stool softener (Colace) will usually help or prevent this problem.  A mild laxative (Milk of Magnesia or Miralax) should be taken according to package directions if there has been no bowel movement after 48 hours.  You have steri-strips and a gauze dressing over your incision.  You may remove the gauze bandage on the second day after surgery, and you may shower at that time.  Leave your steri-strips (small skin tapes) in place directly over the incision.  These strips should remain on the skin for 5-7 days and then be removed.  You may get them wet in the shower and pat them dry.  You may resume regular (light) daily activities beginning the next day (such as daily self-care, walking, climbing stairs) gradually increasing activities as tolerated.  You may have sexual intercourse when it is comfortable.  Refrain from any heavy lifting or straining until approved by your doctor.  You may drive when you no longer are taking prescription pain medication, you can comfortably wear a seatbelt, and you can safely maneuver your car and apply brakes.  You should see your doctor in the office for a follow-up appointment approximately three weeks after your surgery.  Make sure that  you call for this appointment within a day or two after you arrive home to insure a convenient appointment time.  WHEN TO CALL YOUR DOCTOR: -- Fever greater than 101.5 -- Inability to urinate -- Nausea and/or vomiting - persistent -- Extreme swelling or bruising -- Continued bleeding from incision -- Increased pain, redness, or drainage from the incision -- Difficulty swallowing or  breathing -- Muscle cramping or spasms -- Numbness or tingling in hands or around lips  The clinic staff is available to answer your questions during regular business hours.  Please don't hesitate to call and ask to speak to one of the nurses if you have concerns.  Armandina Gemma, MD [facility] Office: [phone]   Increase activity slowly   Complete by:  As directed    Remove dressing in 24 hours   Complete by:  As directed      Allergies as of 12/03/2017      Reactions   Adhesive [tape]    Had moles removes on back and when they put bandage over it , it caused bumpiness underneath the bandage       Medication List    TAKE these medications   amoxicillin-clavulanate 875-125 MG tablet Commonly known as:  AUGMENTIN Take 1 tablet by mouth 2 (two) times daily. With food   CENTRUM SILVER 50+WOMEN Tabs Take 1 tablet by mouth daily.   ciprofloxacin-dexamethasone OTIC suspension Commonly known as:  CIPRODEX Place 4 drops into the left ear 2 (two) times daily. X 1 week   neomycin-polymyxin-hydrocortisone OTIC solution Commonly known as:  CORTISPORIN Place 4 drops into the left ear 4 (four) times daily.   traMADol 50 MG tablet Commonly known as:  ULTRAM Take 1-2 tablets (50-100 mg total) by mouth every 6 (six) hours as needed.   VITAMIN D3 PO Take 1 tablet by mouth daily.      Follow-up Information    Armandina Gemma, MD. Schedule an appointment as soon as possible for a visit in 3 week(s).   Specialty:  General Surgery Contact information: [address] [place] [address] [phone]           Earnstine Regal, MD, [facility] Office: [phone]   Signed: Earnstine Regal 12/03/2017, 8:59 AM

## 2017-12-04 ENCOUNTER — Encounter: Payer: Self-pay | Admitting: Internal Medicine

## 2017-12-04 DIAGNOSIS — C73 Malignant neoplasm of thyroid gland: Secondary | ICD-10-CM | POA: Insufficient documentation

## 2017-12-05 ENCOUNTER — Ambulatory Visit: Payer: Self-pay | Admitting: Surgery

## 2017-12-08 ENCOUNTER — Encounter (HOSPITAL_COMMUNITY): Payer: Self-pay | Admitting: Surgery

## 2017-12-08 DIAGNOSIS — C73 Malignant neoplasm of thyroid gland: Secondary | ICD-10-CM | POA: Diagnosis present

## 2017-12-10 ENCOUNTER — Encounter (HOSPITAL_COMMUNITY)
Admission: RE | Disposition: A | Discharge status: Home / Self Care | Payer: Self-pay | Source: Ambulatory Visit | Attending: Surgery

## 2017-12-10 ENCOUNTER — Other Ambulatory Visit: Payer: Self-pay

## 2017-12-10 ENCOUNTER — Ambulatory Visit (HOSPITAL_COMMUNITY): Payer: BLUE CROSS/BLUE SHIELD | Admitting: Registered Nurse

## 2017-12-10 ENCOUNTER — Encounter (HOSPITAL_COMMUNITY): Payer: Self-pay | Admitting: *Deleted

## 2017-12-10 ENCOUNTER — Observation Stay (HOSPITAL_COMMUNITY)
Admission: RE | Admit: 2017-12-10 | Discharge: 2017-12-11 | Disposition: A | Discharge status: Home / Self Care | Payer: BLUE CROSS/BLUE SHIELD | Source: Ambulatory Visit | Attending: Surgery | Admitting: Surgery

## 2017-12-10 DIAGNOSIS — Z8249 Family history of ischemic heart disease and other diseases of the circulatory system: Secondary | ICD-10-CM | POA: Insufficient documentation

## 2017-12-10 DIAGNOSIS — Z79899 Other long term (current) drug therapy: Secondary | ICD-10-CM | POA: Insufficient documentation

## 2017-12-10 DIAGNOSIS — C73 Malignant neoplasm of thyroid gland: Principal | ICD-10-CM | POA: Diagnosis present

## 2017-12-10 DIAGNOSIS — Z91040 Latex allergy status: Secondary | ICD-10-CM | POA: Insufficient documentation

## 2017-12-10 DIAGNOSIS — I739 Peripheral vascular disease, unspecified: Secondary | ICD-10-CM | POA: Insufficient documentation

## 2017-12-10 DIAGNOSIS — Z87891 Personal history of nicotine dependence: Secondary | ICD-10-CM | POA: Insufficient documentation

## 2017-12-10 DIAGNOSIS — E065 Other chronic thyroiditis: Secondary | ICD-10-CM | POA: Insufficient documentation

## 2017-12-10 DIAGNOSIS — Z888 Allergy status to other drugs, medicaments and biological substances status: Secondary | ICD-10-CM | POA: Diagnosis not present

## 2017-12-10 HISTORY — PX: THYROIDECTOMY: SHX17

## 2017-12-10 LAB — CBC
HCT: 41.2 % (ref 36.0–46.0)
Hemoglobin: 14.3 g/dL (ref 12.0–15.0)
MCH: 32 pg (ref 26.0–34.0)
MCHC: 34.7 g/dL (ref 30.0–36.0)
MCV: 92.2 fL (ref 78.0–100.0)
Platelets: 250 10*3/uL (ref 150–400)
RBC: 4.47 MIL/uL (ref 3.87–5.11)
RDW: 12.3 % (ref 11.5–15.5)
WBC: 5.5 10*3/uL (ref 4.0–10.5)

## 2017-12-10 SURGERY — THYROIDECTOMY
Anesthesia: General

## 2017-12-10 MED ORDER — CEFAZOLIN SODIUM-DEXTROSE 2-4 GM/100ML-% IV SOLN
2.0000 g | INTRAVENOUS | Status: AC
  Administered 2017-12-10: 2 g via INTRAVENOUS
  Filled 2017-12-10: qty 100

## 2017-12-10 MED ORDER — FENTANYL CITRATE (PF) 100 MCG/2ML IJ SOLN
INTRAMUSCULAR | Status: DC | PRN
  Administered 2017-12-10: 50 ug via INTRAVENOUS
  Administered 2017-12-10: 100 ug via INTRAVENOUS
  Administered 2017-12-10: 50 ug via INTRAVENOUS

## 2017-12-10 MED ORDER — HYDROCODONE-ACETAMINOPHEN 5-325 MG PO TABS
1.0000 | ORAL_TABLET | ORAL | Status: DC | PRN
  Administered 2017-12-10: 2 via ORAL
  Filled 2017-12-10: qty 2

## 2017-12-10 MED ORDER — MIDAZOLAM HCL 2 MG/2ML IJ SOLN
INTRAMUSCULAR | Status: AC
  Filled 2017-12-10: qty 2

## 2017-12-10 MED ORDER — CHLORHEXIDINE GLUCONATE CLOTH 2 % EX PADS
6.0000 | MEDICATED_PAD | Freq: Once | CUTANEOUS | Status: AC
  Administered 2017-12-10: 6 via TOPICAL

## 2017-12-10 MED ORDER — ONDANSETRON HCL 4 MG/2ML IJ SOLN
INTRAMUSCULAR | Status: AC
  Filled 2017-12-10: qty 2

## 2017-12-10 MED ORDER — ONDANSETRON HCL 4 MG/2ML IJ SOLN
INTRAMUSCULAR | Status: DC | PRN
  Administered 2017-12-10: 4 mg via INTRAVENOUS

## 2017-12-10 MED ORDER — HYDROMORPHONE HCL 1 MG/ML IJ SOLN
1.0000 mg | INTRAMUSCULAR | Status: DC | PRN
  Administered 2017-12-10: 1 mg via INTRAVENOUS
  Filled 2017-12-10: qty 1

## 2017-12-10 MED ORDER — ACETAMINOPHEN 325 MG PO TABS: 650.0000 mg | ORAL_TABLET | Freq: Four times a day (QID) | ORAL | Status: DC | PRN

## 2017-12-10 MED ORDER — FENTANYL CITRATE (PF) 100 MCG/2ML IJ SOLN
INTRAMUSCULAR | Status: AC
  Filled 2017-12-10: qty 2

## 2017-12-10 MED ORDER — LIDOCAINE 2% (20 MG/ML) 5 ML SYRINGE
INTRAMUSCULAR | Status: DC | PRN
  Administered 2017-12-10: 20 mg via INTRAVENOUS
  Administered 2017-12-10: 80 mg via INTRAVENOUS

## 2017-12-10 MED ORDER — DEXAMETHASONE SODIUM PHOSPHATE 10 MG/ML IJ SOLN
INTRAMUSCULAR | Status: AC
  Filled 2017-12-10: qty 1

## 2017-12-10 MED ORDER — TRAMADOL HCL 50 MG PO TABS: 50.0000 mg | ORAL_TABLET | Freq: Four times a day (QID) | ORAL | Status: DC | PRN

## 2017-12-10 MED ORDER — SUGAMMADEX SODIUM 200 MG/2ML IV SOLN
INTRAVENOUS | Status: DC | PRN
  Administered 2017-12-10: 200 mg via INTRAVENOUS

## 2017-12-10 MED ORDER — ONDANSETRON 4 MG PO TBDP: 4.0000 mg | ORAL_TABLET | Freq: Four times a day (QID) | ORAL | Status: DC | PRN

## 2017-12-10 MED ORDER — MIDAZOLAM HCL 5 MG/5ML IJ SOLN
INTRAMUSCULAR | Status: DC | PRN
  Administered 2017-12-10: 2 mg via INTRAVENOUS

## 2017-12-10 MED ORDER — LACTATED RINGERS IV SOLN
INTRAVENOUS | Status: DC
  Administered 2017-12-10: 12:00:00 via INTRAVENOUS

## 2017-12-10 MED ORDER — KCL IN DEXTROSE-NACL 20-5-0.45 MEQ/L-%-% IV SOLN
INTRAVENOUS | Status: DC
  Administered 2017-12-10: 18:00:00 via INTRAVENOUS
  Filled 2017-12-10: qty 1000

## 2017-12-10 MED ORDER — CALCIUM CARBONATE 1250 (500 CA) MG PO TABS
2.0000 | ORAL_TABLET | Freq: Three times a day (TID) | ORAL | Status: DC
  Administered 2017-12-10 – 2017-12-11 (×2): 1000 mg via ORAL
  Filled 2017-12-10 (×2): qty 1

## 2017-12-10 MED ORDER — HEMOSTATIC AGENTS (NO CHARGE) OPTIME
TOPICAL | Status: DC | PRN
  Administered 2017-12-10: 1 via TOPICAL

## 2017-12-10 MED ORDER — LIDOCAINE 2% (20 MG/ML) 5 ML SYRINGE
INTRAMUSCULAR | Status: AC
  Filled 2017-12-10: qty 5

## 2017-12-10 MED ORDER — PROPOFOL 10 MG/ML IV BOLUS
INTRAVENOUS | Status: DC | PRN
  Administered 2017-12-10: 170 mg via INTRAVENOUS

## 2017-12-10 MED ORDER — HYDROMORPHONE HCL 1 MG/ML IJ SOLN: 0.2500 mg | INTRAMUSCULAR | Status: DC | PRN

## 2017-12-10 MED ORDER — ONDANSETRON HCL 4 MG/2ML IJ SOLN
4.0000 mg | Freq: Four times a day (QID) | INTRAMUSCULAR | Status: DC | PRN
  Administered 2017-12-10: 4 mg via INTRAVENOUS

## 2017-12-10 MED ORDER — DEXAMETHASONE SODIUM PHOSPHATE 10 MG/ML IJ SOLN
INTRAMUSCULAR | Status: DC | PRN
  Administered 2017-12-10: 10 mg via INTRAVENOUS

## 2017-12-10 MED ORDER — ROCURONIUM BROMIDE 10 MG/ML (PF) SYRINGE
PREFILLED_SYRINGE | INTRAVENOUS | Status: AC
  Filled 2017-12-10: qty 10

## 2017-12-10 MED ORDER — 0.9 % SODIUM CHLORIDE (POUR BTL) OPTIME
TOPICAL | Status: DC | PRN
  Administered 2017-12-10: 1000 mL

## 2017-12-10 MED ORDER — ROCURONIUM BROMIDE 10 MG/ML (PF) SYRINGE
PREFILLED_SYRINGE | INTRAVENOUS | Status: DC | PRN
  Administered 2017-12-10: 10 mg via INTRAVENOUS
  Administered 2017-12-10: 45 mg via INTRAVENOUS

## 2017-12-10 MED ORDER — ACETAMINOPHEN 650 MG RE SUPP: 650.0000 mg | Freq: Four times a day (QID) | RECTAL | Status: DC | PRN

## 2017-12-10 MED ORDER — PROPOFOL 10 MG/ML IV BOLUS
INTRAVENOUS | Status: AC
  Filled 2017-12-10: qty 20

## 2017-12-10 SURGICAL SUPPLY — 35 items
ATTRACTOMAT 16X20 MAGNETIC DRP (DRAPES) ×2 IMPLANT
BLADE SURG 15 STRL LF DISP TIS (BLADE) ×1 IMPLANT
BLADE SURG 15 STRL SS (BLADE) ×1
CHLORAPREP W/TINT 26ML (MISCELLANEOUS) ×2 IMPLANT
CLIP VESOCCLUDE MED 6/CT (CLIP) ×4 IMPLANT
CLIP VESOCCLUDE SM WIDE 6/CT (CLIP) ×6 IMPLANT
COVER SURGICAL LIGHT HANDLE (MISCELLANEOUS) ×2 IMPLANT
DERMABOND ADVANCED (GAUZE/BANDAGES/DRESSINGS) ×1
DERMABOND ADVANCED .7 DNX12 (GAUZE/BANDAGES/DRESSINGS) ×1 IMPLANT
DISSECTOR ROUND CHERRY 3/8 STR (MISCELLANEOUS) IMPLANT
DRAPE LAPAROTOMY T 98X78 PEDS (DRAPES) ×2 IMPLANT
ELECT PENCIL ROCKER SW 15FT (MISCELLANEOUS) ×2 IMPLANT
ELECT REM PT RETURN 15FT ADLT (MISCELLANEOUS) ×2 IMPLANT
GAUZE 4X4 16PLY RFD (DISPOSABLE) ×2 IMPLANT
GAUZE SPONGE 4X4 12PLY STRL (GAUZE/BANDAGES/DRESSINGS) IMPLANT
GLOVE SURG ORTHO 8.0 STRL STRW (GLOVE) ×2 IMPLANT
GOWN STRL REUS W/TWL XL LVL3 (GOWN DISPOSABLE) ×2 IMPLANT
HEMOSTAT SURGICEL 2X4 FIBR (HEMOSTASIS) ×2 IMPLANT
ILLUMINATOR WAVEGUIDE N/F (MISCELLANEOUS) ×2 IMPLANT
KIT BASIN OR (CUSTOM PROCEDURE TRAY) ×2 IMPLANT
LIGHT WAVEGUIDE WIDE FLAT (MISCELLANEOUS) IMPLANT
PACK BASIC VI WITH GOWN DISP (CUSTOM PROCEDURE TRAY) ×2 IMPLANT
POWDER SURGICEL 3.0 GRAM (HEMOSTASIS) IMPLANT
SHEARS HARMONIC 9CM CVD (BLADE) ×2 IMPLANT
STRIP CLOSURE SKIN 1/2X4 (GAUZE/BANDAGES/DRESSINGS) IMPLANT
SUT MNCRL AB 4-0 PS2 18 (SUTURE) ×2 IMPLANT
SUT SILK 2 0 (SUTURE)
SUT SILK 2-0 18XBRD TIE 12 (SUTURE) IMPLANT
SUT SILK 3 0 (SUTURE)
SUT SILK 3-0 18XBRD TIE 12 (SUTURE) IMPLANT
SUT VIC AB 3-0 SH 18 (SUTURE) ×4 IMPLANT
SYR BULB IRRIGATION 50ML (SYRINGE) ×2 IMPLANT
TOWEL OR 17X26 10 PK STRL BLUE (TOWEL DISPOSABLE) ×2 IMPLANT
TOWEL OR NON WOVEN STRL DISP B (DISPOSABLE) ×2 IMPLANT
YANKAUER SUCT BULB TIP 10FT TU (MISCELLANEOUS) ×2 IMPLANT

## 2017-12-10 NOTE — Anesthesia Postprocedure Evaluation (Signed)
Anesthesia Post Note  Patient: Madison [name]  Procedure(s) Performed: COMPLETION THYROIDECTOMY (N/A )     Anesthesia Post Evaluation  Last Vitals:  Vitals:   12/10/17 1615 12/10/17 1630  BP: (!) 152/77 (!) 156/79  Pulse: 75 (!) 54  Resp: (!) 22 16  Temp:  37.1 C  SpO2: 100% 100%    Last Pain:  Vitals:   12/10/17 1630  TempSrc:   PainSc: 0-No pain                 Aizza Santiago

## 2017-12-10 NOTE — Interval H&P Note (Signed)
History and Physical Interval Note:  12/10/2017 2:20 PM  Madison [name]  has presented today for surgery, with the diagnosis of THYROID CARCINOMA.  The various methods of treatment have been discussed with the patient and family. After consideration of risks, benefits and other options for treatment, the patient has consented to    Procedure(s): COMPLETION THYROIDECTOMY (N/A) as a surgical intervention .    The patient's history has been reviewed, patient examined, no change in status, stable for surgery.  I have reviewed the patient's chart and labs.  Questions were answered to the patient's satisfaction.    Armandina Gemma, [facility] Office: [place]

## 2017-12-10 NOTE — Transfer of Care (Signed)
Immediate Anesthesia Transfer of Care Note  Patient: Madison [name]  Procedure(s) Performed: COMPLETION THYROIDECTOMY (N/A )  Patient Location: PACU  Anesthesia Type:General  Level of Consciousness: awake, alert , oriented and patient cooperative  Airway & Oxygen Therapy: Patient Spontanous Breathing and Patient connected to face mask oxygen  Post-op Assessment: Report given to RN and Post -op Vital signs reviewed and stable  Post vital signs: Reviewed and stable  Last Vitals:  Vitals Value Taken Time  BP 152/77 12/10/2017  4:15 PM  Temp 36.9 C 12/10/2017  4:08 PM  Pulse 65 12/10/2017  4:16 PM  Resp 23 12/10/2017  4:16 PM  SpO2 100 % 12/10/2017  4:16 PM  Vitals shown include unvalidated device data.  Last Pain:  Vitals:   12/10/17 1214  TempSrc:   PainSc: 0-No pain         Complications: No apparent anesthesia complications

## 2017-12-10 NOTE — Anesthesia Preprocedure Evaluation (Addendum)
Anesthesia Evaluation  Patient identified by MRN, date of birth, ID band Patient awake    Reviewed: Allergy & Precautions, NPO status , Patient's Chart, lab work & pertinent test results  History of Anesthesia Complications (+) PONV  Airway Mallampati: II  TM Distance: >3 FB     Dental   Pulmonary former smoker,    breath sounds clear to auscultation       Cardiovascular + Peripheral Vascular Disease   Rhythm:Regular Rate:Normal     Neuro/Psych    GI/Hepatic negative GI ROS, Neg liver ROS,   Endo/Other  negative endocrine ROSHistory noted. CG  Renal/GU negative Renal ROS     Musculoskeletal   Abdominal   Peds  Hematology   Anesthesia Other Findings   Reproductive/Obstetrics                           Anesthesia Physical Anesthesia Plan  ASA: III  Anesthesia Plan: General   Post-op Pain Management:    Induction: Intravenous  PONV Risk Score and Plan: 4 or greater and Treatment may vary due to age or medical condition, Ondansetron, Dexamethasone and Midazolam  Airway Management Planned: Oral ETT  Additional Equipment:   Intra-op Plan:   Post-operative Plan: Extubation in OR  Informed Consent: I have reviewed the patients History and Physical, chart, labs and discussed the procedure including the risks, benefits and alternatives for the proposed anesthesia with the patient or authorized representative who has indicated his/her understanding and acceptance.   Dental advisory given  Plan Discussed with: CRNA and Anesthesiologist  Anesthesia Plan Comments:         Anesthesia Quick Evaluation

## 2017-12-10 NOTE — Op Note (Signed)
Procedure Note  Pre-operative Diagnosis:  Hurthle cell carcinoma  Post-operative Diagnosis:  same  Surgeon:  Armandina Gemma, MD  Assistant:  none   Procedure:  Completion thyroidectomy  Anesthesia:  General  Estimated Blood Loss:  minimal  Drains: none         Specimen: thyroid lobe to pathology  Indications:  Patient returns to the OR for completion thyroidectomy having undergone left thyroid lobectomy last week.  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  Procedure Details: Procedure was done in OR #5 at the [facility].  The patient was brought to the operating room and placed in a supine position on the operating room table.  Following administration of general anesthesia, the patient was positioned and then prepped and draped in the usual aseptic fashion.  After ascertaining that an adequate level of anesthesia had been achieved, the previous Kocher incision was re-opened with #15 blade.  Dissection was carried through subcutaneous tissues and platysma. Hemostasis was achieved with the electrocautery.  Skin flaps were again elevated cephalad and caudad from the thyroid notch to the sternal notch.  A self-retaining retractor was placed for exposure.  Strap muscles were incised in the midline and a small seroma was evacuated.  Strap muscles were reflected laterally.  The right thyroid lobe was normal in size without any obvious nodules.  The lobe was gently mobilized with blunt dissection.  Superior pole vessels were dissected out and divided individually between small and medium Ligaclips with the Harmonic scalpel.  The thyroid lobe was rolled anteriorly.  Branches of the inferior thyroid artery were divided between small Ligaclips with the Harmonic scalpel.  Inferior venous tributaries were divided between Ligaclips.  The recurrent laryngeal nerve was identified and preserved along its course.  The ligament of Gwenlyn Found was released with the electrocautery and the gland was  mobilized onto the anterior trachea from which it was excised with the electrocautery.  The thyroid lobe was submitted to pathology for review.  The neck was irrigated with warm saline.  Fibrillar was placed throughout the operative field.  Strap muscles were reapproximated in the midline with interrupted 3-0 Vicryl sutures.  Platysma was closed with interrupted 3-0 Vicryl sutures.  Skin was closed with a running 4-0 Monocryl subcuticular suture.  Wound was washed and dried and Dermabond was applied.  The patient was awakened from anesthesia and brought to the recovery room.  The patient tolerated the procedure well.   Armandina Gemma, MD [facility] Office: [phone]

## 2017-12-10 NOTE — Anesthesia Procedure Notes (Signed)
Procedure Name: Intubation Date/Time: 12/10/2017 2:44 PM Performed by: Victoriano Lain, CRNA Pre-anesthesia Checklist: Patient identified, Emergency Drugs available, Suction available, Patient being monitored and Timeout performed Patient Re-evaluated:Patient Re-evaluated prior to induction Oxygen Delivery Method: Circle system utilized Preoxygenation: Pre-oxygenation with 100% oxygen Induction Type: IV induction Ventilation: Mask ventilation without difficulty Laryngoscope Size: Mac and 3 Grade View: Grade I Tube type: Oral Tube size: 7.5 mm Number of attempts: 1 Airway Equipment and Method: Stylet Placement Confirmation: ETT inserted through vocal cords under direct vision,  positive ETCO2 and breath sounds checked- equal and bilateral Secured at: 21 cm Tube secured with: Tape Dental Injury: Teeth and Oropharynx as per pre-operative assessment

## 2017-12-11 ENCOUNTER — Encounter (HOSPITAL_COMMUNITY): Payer: Self-pay | Admitting: Surgery

## 2017-12-11 DIAGNOSIS — E065 Other chronic thyroiditis: Secondary | ICD-10-CM | POA: Diagnosis not present

## 2017-12-11 DIAGNOSIS — Z91040 Latex allergy status: Secondary | ICD-10-CM | POA: Diagnosis not present

## 2017-12-11 DIAGNOSIS — I739 Peripheral vascular disease, unspecified: Secondary | ICD-10-CM | POA: Diagnosis not present

## 2017-12-11 DIAGNOSIS — Z79899 Other long term (current) drug therapy: Secondary | ICD-10-CM | POA: Diagnosis not present

## 2017-12-11 DIAGNOSIS — Z8249 Family history of ischemic heart disease and other diseases of the circulatory system: Secondary | ICD-10-CM | POA: Diagnosis not present

## 2017-12-11 DIAGNOSIS — Z87891 Personal history of nicotine dependence: Secondary | ICD-10-CM | POA: Diagnosis not present

## 2017-12-11 DIAGNOSIS — Z888 Allergy status to other drugs, medicaments and biological substances status: Secondary | ICD-10-CM | POA: Diagnosis not present

## 2017-12-11 DIAGNOSIS — C73 Malignant neoplasm of thyroid gland: Secondary | ICD-10-CM | POA: Diagnosis not present

## 2017-12-11 LAB — BASIC METABOLIC PANEL
Anion gap: 7 (ref 5–15)
BUN: 11 mg/dL (ref 6–20)
CHLORIDE: 109 mmol/L (ref 98–111)
CO2: 24 mmol/L (ref 22–32)
CREATININE: 0.71 mg/dL (ref 0.44–1.00)
Calcium: 9.2 mg/dL (ref 8.9–10.3)
GFR calc Af Amer: >60 mL/min (ref >60)
GFR calc non Af Amer: >60 mL/min (ref >60)
GLUCOSE: 140 mg/dL — AB (ref 70–99)
POTASSIUM: 4.5 mmol/L (ref 3.5–5.1)
Sodium: 140 mmol/L (ref 135–145)

## 2017-12-11 MED ORDER — CALCIUM CARBONATE ANTACID 500 MG PO CHEW: 2.0000 | CHEWABLE_TABLET | Freq: Two times a day (BID) | ORAL | 1 refills | Status: DC

## 2017-12-11 MED ORDER — LEVOTHYROXINE SODIUM 100 MCG PO TABS: 100.0000 ug | ORAL_TABLET | Freq: Every day | ORAL | 3 refills | Status: DC

## 2017-12-11 NOTE — Discharge Summary (Signed)
Physician Discharge Summary [facility]  Patient ID: Madison [name] MRN: [id number] DOB/AGE: 59-16-1962 59 y.o.  Admit date: 12/10/2017 Discharge date: 12/11/2017  Admission Diagnoses:  Hurthle cell carcinoma of the thyroid  Discharge Diagnoses:  Principal Problem:   Hurthle cell carcinoma of thyroid [facility])   Discharged Condition: good  Hospital Course: Patient was admitted for observation following thyroid surgery.  Post op course was uncomplicated.  Pain was well controlled.  Tolerated diet.  Post op calcium level on morning following surgery was 9.2 mg/dl.  Patient was prepared for discharge home on POD#1.  Consults: None  Treatments: surgery: completion thyroidectomy  Discharge Exam: Blood pressure 116/62, pulse (!) 56, temperature (!) 97.5 F (36.4 C), temperature source Oral, resp. rate 18, height 5\' 9"  (1.753 m), weight 72.6 kg, SpO2 92 %. HEENT - clear Neck - wound dry and intact; voice normal Chest - clear bilaterally Cor - RRR  Disposition: Home  Discharge Instructions    Diet - low sodium heart healthy   Complete by:  As directed    Discharge instructions   Complete by:  As directed    [facility]  THYROID & PARATHYROID SURGERY:  POST-OP INSTRUCTIONS  Always review your discharge instruction sheet from the facility where your surgery was performed.  A prescription for pain medication may be given to you upon discharge.  Take your pain medication as prescribed.  If narcotic pain medicine is not needed, then you may take acetaminophen (Tylenol) or ibuprofen (Advil) as needed.  Take your usually prescribed medications unless otherwise directed.  If you need a refill on your pain medication, please contact our office during regular business hours.  Prescriptions cannot be processed by our office after 5 pm or on weekends.  Start with a light diet upon arrival home, such as soup and crackers or toast.  Be sure to drink  plenty of fluids daily.  Resume your normal diet the day after surgery.  Most patients will experience some swelling and bruising on the chest and neck area.  Ice packs will help.  Swelling and bruising can take several days to resolve.   It is common to experience some constipation after surgery.  Increasing fluid intake and taking a stool softener (Colace) will usually help or prevent this problem.  A mild laxative (Milk of Magnesia or Miralax) should be taken according to package directions if there has been no bowel movement after 48 hours.  You have steri-strips and a gauze dressing over your incision.  You may remove the gauze bandage on the second day after surgery, and you may shower at that time.  Leave your steri-strips (small skin tapes) in place directly over the incision.  These strips should remain on the skin for 5-7 days and then be removed.  You may get them wet in the shower and pat them dry.  You may resume regular (light) daily activities beginning the next day (such as daily self-care, walking, climbing stairs) gradually increasing activities as tolerated.  You may have sexual intercourse when it is comfortable.  Refrain from any heavy lifting or straining until approved by your doctor.  You may drive when you no longer are taking prescription pain medication, you can comfortably wear a seatbelt, and you can safely maneuver your car and apply brakes.  You should see your doctor in the office for a follow-up appointment approximately three weeks after your surgery.  Make sure that you call for this  appointment within a day or two after you arrive home to insure a convenient appointment time.  WHEN TO CALL YOUR DOCTOR: -- Fever greater than 101.5 -- Inability to urinate -- Nausea and/or vomiting - persistent -- Extreme swelling or bruising -- Continued bleeding from incision -- Increased pain, redness, or drainage from the incision -- Difficulty swallowing or breathing --  Muscle cramping or spasms -- Numbness or tingling in hands or around lips  The clinic staff is available to answer your questions during regular business hours.  Please don't hesitate to call and ask to speak to one of the nurses if you have concerns.  Armandina Gemma, MD [facility] Office: [phone]   Increase activity slowly   Complete by:  As directed    No dressing needed   Complete by:  As directed      Allergies as of 12/11/2017      Reactions   Adhesive [tape]    Had moles removes on back and when they put bandage over it , it caused bumpiness underneath the bandage       Medication List    TAKE these medications   amoxicillin-clavulanate 875-125 MG tablet Commonly known as:  AUGMENTIN Take 1 tablet by mouth 2 (two) times daily. With food   calcium carbonate 500 MG chewable tablet Commonly known as:  TUMS - dosed in mg elemental calcium Chew 2 tablets (400 mg of elemental calcium total) by mouth 2 (two) times daily.   CENTRUM SILVER 50+WOMEN Tabs Take 1 tablet by mouth daily.   ciprofloxacin-dexamethasone OTIC suspension Commonly known as:  CIPRODEX Place 4 drops into the left ear 2 (two) times daily. X 1 week   levothyroxine 100 MCG tablet Commonly known as:  SYNTHROID, LEVOTHROID Take 1 tablet (100 mcg total) by mouth daily.   neomycin-polymyxin-hydrocortisone OTIC solution Commonly known as:  CORTISPORIN Place 4 drops into the left ear 4 (four) times daily.   traMADol 50 MG tablet Commonly known as:  ULTRAM Take 1-2 tablets (50-100 mg total) by mouth every 6 (six) hours as needed.   VITAMIN D3 PO Take 1 tablet by mouth daily.        Earnstine Regal, MD, [facility] Office: [phone]   Signed: Earnstine Regal 12/11/2017, 7:23 AM

## 2017-12-11 NOTE — Progress Notes (Signed)
Discharge instructions discussed with patient, verbalized agreement and understanding 

## 2017-12-15 NOTE — Progress Notes (Signed)
Please contact patient and notify of benign pathology results.  Tobin Cadiente M. Khyleigh Furney, MD, FACS [facility] Office: [phone]   

## 2017-12-23 DIAGNOSIS — E890 Postprocedural hypothyroidism: Secondary | ICD-10-CM | POA: Diagnosis not present

## 2017-12-23 DIAGNOSIS — C73 Malignant neoplasm of thyroid gland: Secondary | ICD-10-CM | POA: Diagnosis not present

## 2017-12-24 DIAGNOSIS — D440 Neoplasm of uncertain behavior of thyroid gland: Secondary | ICD-10-CM | POA: Diagnosis not present

## 2017-12-25 ENCOUNTER — Other Ambulatory Visit: Payer: Self-pay | Admitting: Internal Medicine

## 2017-12-25 DIAGNOSIS — E890 Postprocedural hypothyroidism: Secondary | ICD-10-CM

## 2017-12-25 DIAGNOSIS — C73 Malignant neoplasm of thyroid gland: Secondary | ICD-10-CM

## 2017-12-30 ENCOUNTER — Other Ambulatory Visit: Payer: Self-pay | Admitting: Internal Medicine

## 2017-12-30 DIAGNOSIS — E890 Postprocedural hypothyroidism: Secondary | ICD-10-CM

## 2017-12-31 ENCOUNTER — Encounter
Admission: RE | Admit: 2017-12-31 | Discharge: 2017-12-31 | Disposition: A | Discharge status: Home / Self Care | Payer: BLUE CROSS/BLUE SHIELD | Source: Ambulatory Visit | Attending: Internal Medicine | Admitting: Internal Medicine

## 2017-12-31 DIAGNOSIS — E890 Postprocedural hypothyroidism: Secondary | ICD-10-CM | POA: Insufficient documentation

## 2017-12-31 DIAGNOSIS — C73 Malignant neoplasm of thyroid gland: Secondary | ICD-10-CM | POA: Insufficient documentation

## 2017-12-31 DIAGNOSIS — H60592 Other noninfective acute otitis externa, left ear: Secondary | ICD-10-CM | POA: Diagnosis not present

## 2017-12-31 MED ORDER — THYROTROPIN ALFA 1.1 MG IM SOLR
0.9000 mg | INTRAMUSCULAR | Status: AC
  Administered 2017-12-31: 0.9 mg via INTRAMUSCULAR
  Filled 2017-12-31: qty 0.9

## 2018-01-01 ENCOUNTER — Encounter
Admission: RE | Admit: 2018-01-01 | Discharge: 2018-01-01 | Disposition: A | Discharge status: Home / Self Care | Payer: BLUE CROSS/BLUE SHIELD | Source: Ambulatory Visit | Attending: Internal Medicine | Admitting: Internal Medicine

## 2018-01-01 DIAGNOSIS — C73 Malignant neoplasm of thyroid gland: Secondary | ICD-10-CM | POA: Diagnosis not present

## 2018-01-01 DIAGNOSIS — E890 Postprocedural hypothyroidism: Secondary | ICD-10-CM | POA: Diagnosis not present

## 2018-01-01 MED ORDER — THYROTROPIN ALFA 1.1 MG IM SOLR
0.9000 mg | INTRAMUSCULAR | Status: AC
  Administered 2018-01-01: 0.9 mg via INTRAMUSCULAR

## 2018-01-02 ENCOUNTER — Encounter
Admission: RE | Admit: 2018-01-02 | Discharge: 2018-01-02 | Disposition: A | Discharge status: Home / Self Care | Payer: BLUE CROSS/BLUE SHIELD | Source: Ambulatory Visit | Attending: Internal Medicine | Admitting: Internal Medicine

## 2018-01-02 DIAGNOSIS — C73 Malignant neoplasm of thyroid gland: Secondary | ICD-10-CM | POA: Diagnosis not present

## 2018-01-02 DIAGNOSIS — E890 Postprocedural hypothyroidism: Secondary | ICD-10-CM | POA: Diagnosis not present

## 2018-01-02 MED ORDER — SODIUM IODIDE I 131 CAPSULE
70.0000 | Freq: Once | INTRAVENOUS | Status: AC | PRN
  Administered 2018-01-02: 74.77 via ORAL

## 2018-01-10 ENCOUNTER — Ambulatory Visit
Admission: RE | Admit: 2018-01-10 | Discharge: 2018-01-10 | Disposition: A | Discharge status: Home / Self Care | Payer: BLUE CROSS/BLUE SHIELD | Source: Ambulatory Visit | Attending: Internal Medicine | Admitting: Internal Medicine

## 2018-01-10 DIAGNOSIS — C73 Malignant neoplasm of thyroid gland: Secondary | ICD-10-CM | POA: Diagnosis not present

## 2018-01-10 DIAGNOSIS — E890 Postprocedural hypothyroidism: Secondary | ICD-10-CM

## 2018-01-20 DIAGNOSIS — Z713 Dietary counseling and surveillance: Secondary | ICD-10-CM | POA: Diagnosis not present

## 2018-01-29 DIAGNOSIS — Z713 Dietary counseling and surveillance: Secondary | ICD-10-CM | POA: Diagnosis not present

## 2018-01-30 DIAGNOSIS — Z713 Dietary counseling and surveillance: Secondary | ICD-10-CM | POA: Diagnosis not present

## 2018-02-05 DIAGNOSIS — Z713 Dietary counseling and surveillance: Secondary | ICD-10-CM | POA: Diagnosis not present

## 2018-02-18 DIAGNOSIS — C73 Malignant neoplasm of thyroid gland: Secondary | ICD-10-CM | POA: Diagnosis not present

## 2018-02-18 DIAGNOSIS — E890 Postprocedural hypothyroidism: Secondary | ICD-10-CM | POA: Diagnosis not present

## 2018-02-19 DIAGNOSIS — Z713 Dietary counseling and surveillance: Secondary | ICD-10-CM | POA: Diagnosis not present

## 2018-02-20 DIAGNOSIS — E890 Postprocedural hypothyroidism: Secondary | ICD-10-CM | POA: Diagnosis not present

## 2018-02-20 DIAGNOSIS — C73 Malignant neoplasm of thyroid gland: Secondary | ICD-10-CM | POA: Diagnosis not present

## 2018-03-24 DIAGNOSIS — Z713 Dietary counseling and surveillance: Secondary | ICD-10-CM | POA: Diagnosis not present

## 2018-04-01 DIAGNOSIS — D2261 Melanocytic nevi of right upper limb, including shoulder: Secondary | ICD-10-CM | POA: Diagnosis not present

## 2018-04-01 DIAGNOSIS — L578 Other skin changes due to chronic exposure to nonionizing radiation: Secondary | ICD-10-CM | POA: Diagnosis not present

## 2018-04-01 DIAGNOSIS — D1801 Hemangioma of skin and subcutaneous tissue: Secondary | ICD-10-CM | POA: Diagnosis not present

## 2018-04-01 DIAGNOSIS — D225 Melanocytic nevi of trunk: Secondary | ICD-10-CM | POA: Diagnosis not present

## 2018-04-02 DIAGNOSIS — Z713 Dietary counseling and surveillance: Secondary | ICD-10-CM | POA: Diagnosis not present

## 2018-04-25 IMAGING — US US EXTREM UP *R* LTD
1 series · 7 of 7 positions shown · non-contrast
Comparison: None

CLINICAL DATA: Right upper extremity medial palpable mass.

EXAM:
ULTRASOUND right UPPER EXTREMITY LIMITED
TECHNIQUE: Ultrasound examination of the upper extremity soft tissues was
performed in the area of clinical concern.

[Series 1: us extrem up *right* ltd · 0.07mm/px · 7 of 7 slices shown]
[im 1/7]
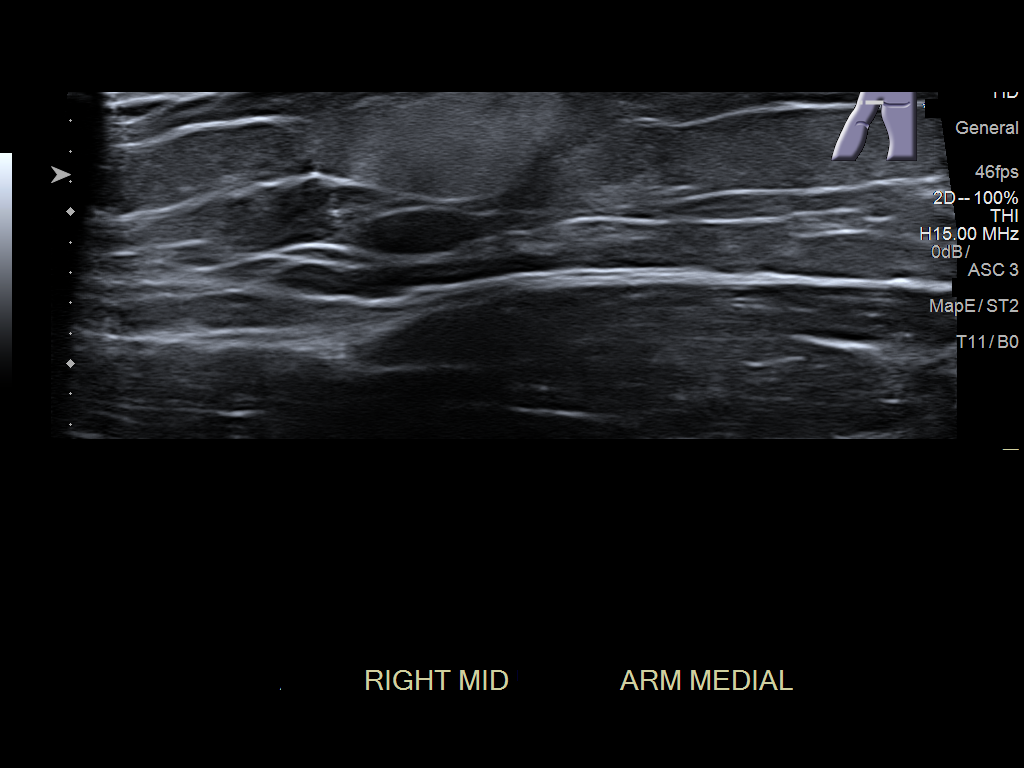
[im 2/7]
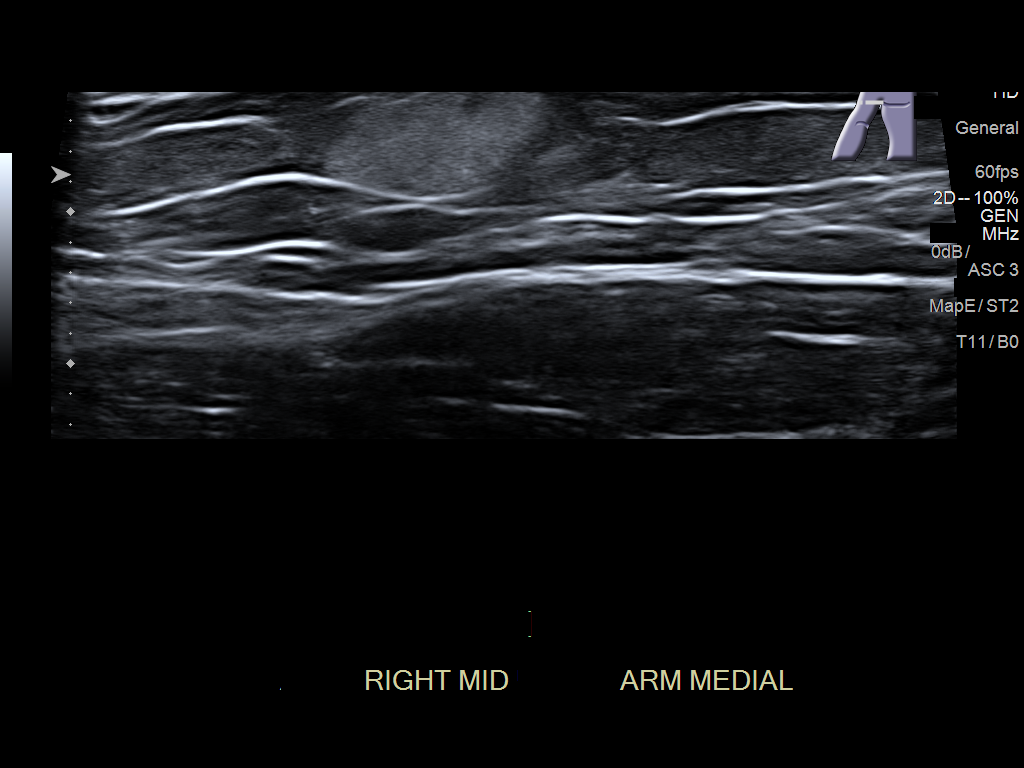
[im 3/7]
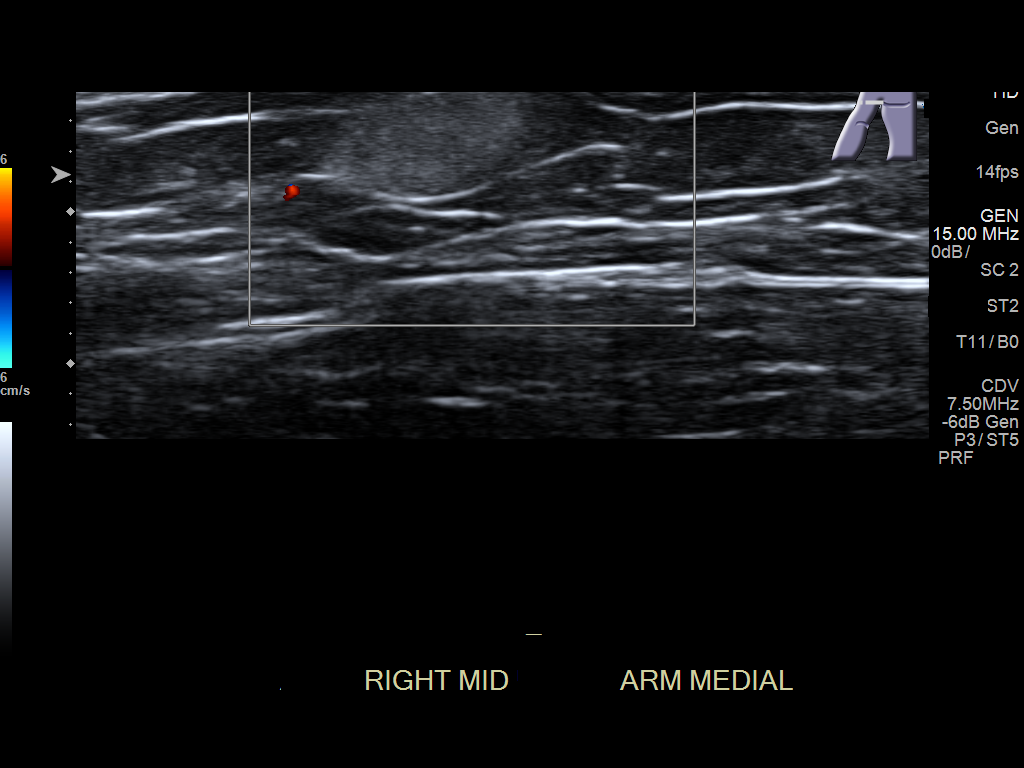
[im 4/7]
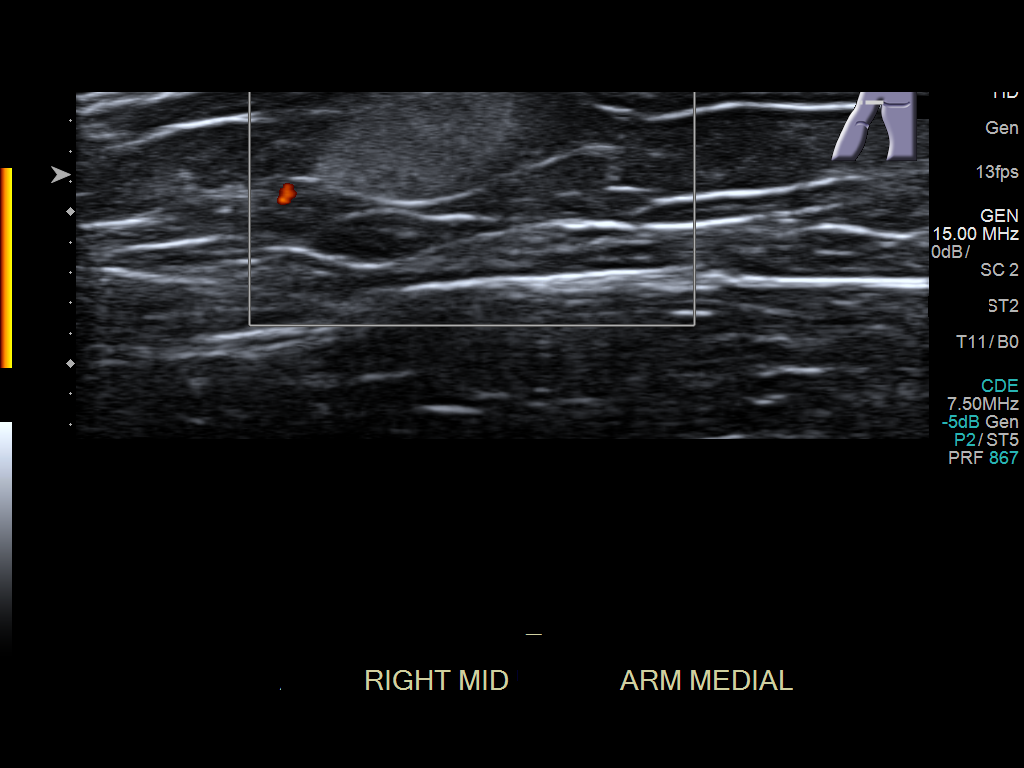
[im 5/7]
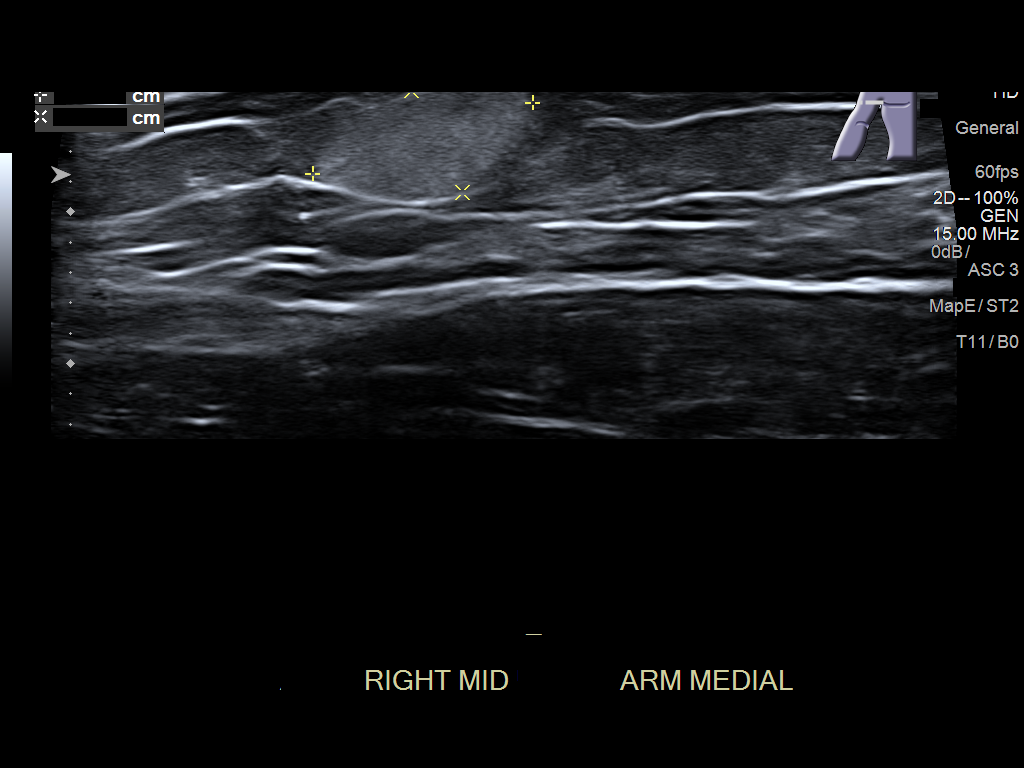
[im 6/7]
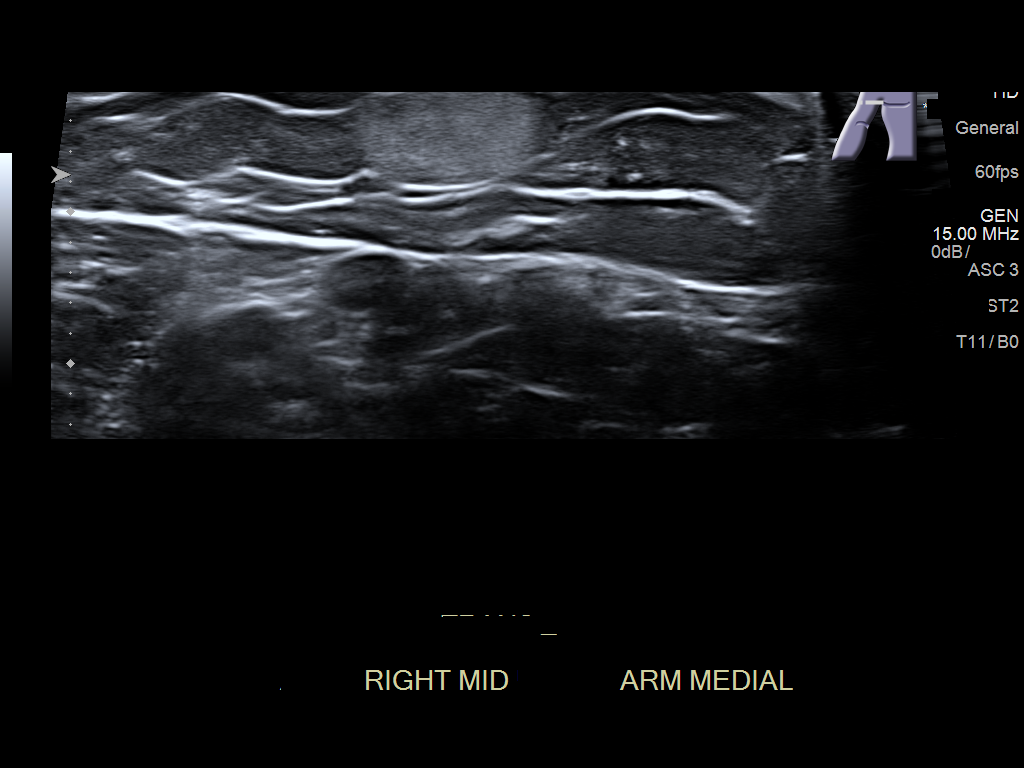
[im 7/7]
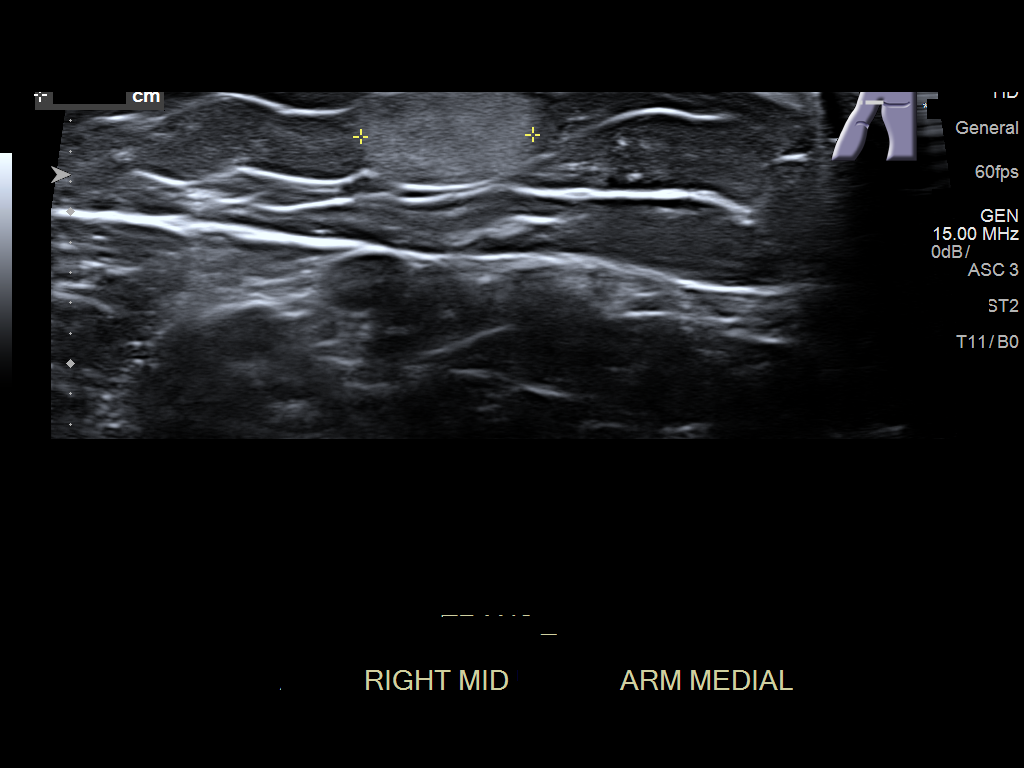

[7 of 7 positions shown; findings below may reference images not displayed]

FINDINGS: There is a homogeneously hyperechoic lesion in the subcutaneous fat
of the medial upper arm. This is more echogenic than the surrounding
subcutaneous fat and appears fairly well-circumscribed. No enhanced
through transmission to suggest a complex or hemorrhagic cyst. This
does not have the typical appearance of a lipoma. Recommend MR
imaging without and with contrast for further evaluation.
IMPRESSION: Solid-appearing 1.5 x 0.8 x 1.1 cm lesion correlating with the
patient's palpable abnormality. Recommend MR imaging without and
with contrast for further evaluation.

## 2018-05-25 DIAGNOSIS — Z23 Encounter for immunization: Secondary | ICD-10-CM | POA: Diagnosis not present

## 2018-05-28 DIAGNOSIS — C73 Malignant neoplasm of thyroid gland: Secondary | ICD-10-CM | POA: Diagnosis not present

## 2018-05-28 DIAGNOSIS — E890 Postprocedural hypothyroidism: Secondary | ICD-10-CM | POA: Diagnosis not present

## 2018-08-04 ENCOUNTER — Telehealth: Payer: Self-pay

## 2018-08-04 NOTE — Telephone Encounter (Signed)
Correct we are not testing exposures unless she develops sx's I.e chest pain, shortness of breath now we have specific testing sites.   she will need to quarantine herself if she thinks she was exposed to someone with COVID for 14 days and I do not rec. She go on the trip   If she develops any sx's call back  She can call the [place] hotline 336 [phone] or [place] covid hotline [phone]

## 2018-08-04 NOTE — Telephone Encounter (Signed)
Copied from [place] [id number]. Topic: General - Other >> Aug 04, 2018  9:11 AM Celene Kras A wrote: Reason for CRM: Pt states she was in contact with someone at her job with a high fever. She is concerned he may have had covid-19 and she may have been exposed. Pt states she is going on a trip and is requesting possible testing.   I called and informed the patient that here we are only testing patient with covid-19 symptoms and who are at high risk, I informed pt to call the health department and maybe they can give her more testing sites.  Pt understood.   Maddy Graham,cma

## 2018-08-19 ENCOUNTER — Encounter: Payer: Self-pay | Admitting: Internal Medicine

## 2018-08-19 ENCOUNTER — Other Ambulatory Visit: Payer: Self-pay | Admitting: Internal Medicine

## 2018-08-19 DIAGNOSIS — E890 Postprocedural hypothyroidism: Secondary | ICD-10-CM

## 2018-08-19 DIAGNOSIS — C73 Malignant neoplasm of thyroid gland: Secondary | ICD-10-CM

## 2018-08-21 ENCOUNTER — Encounter: Payer: BLUE CROSS/BLUE SHIELD | Admitting: Internal Medicine

## 2018-09-01 ENCOUNTER — Other Ambulatory Visit: Payer: Self-pay

## 2018-09-01 ENCOUNTER — Encounter: Payer: Self-pay | Admitting: Obstetrics and Gynecology

## 2018-09-01 ENCOUNTER — Ambulatory Visit (INDEPENDENT_AMBULATORY_CARE_PROVIDER_SITE_OTHER): Payer: BLUE CROSS/BLUE SHIELD | Admitting: Obstetrics and Gynecology

## 2018-09-01 VITALS — BP 122/64 | HR 64 | Temp 97.9°F | Ht 68.5 in | Wt 167.8 lb

## 2018-09-01 DIAGNOSIS — Z1211 Encounter for screening for malignant neoplasm of colon: Secondary | ICD-10-CM | POA: Diagnosis not present

## 2018-09-01 DIAGNOSIS — Z01419 Encounter for gynecological examination (general) (routine) without abnormal findings: Secondary | ICD-10-CM

## 2018-09-01 NOTE — Progress Notes (Signed)
58 y.o. G2P2 Divorced White or Caucasian Not Hispanic or Latino female here for annual exam.   H/O TVH for fibroids.   She had a thyroidectomy in the fall for thyroid cancer. Then treated with radioiodine. She is doing okay. Not sleeping as soundly. She is gaining weight. She is up 5 lbs in the last year.     No LMP recorded. Patient has had a hysterectomy.          Sexually active: No.  The current method of family planning is post hysterectomy.   Exercising: Yes.    walking 2-3 times a day Smoker:  no  Health Maintenance: Pap:  2018 at [facility] in [place]  History of abnormal Pap:  no MMG:  10/07/2017 Birads 1 negative Colonoscopy:  Never, tried to have one, got very sick with the prep, cologuard not covered by her insurance BMD:   Never TDaP:  07/05/14  Gardasil: no    reports that she quit smoking about 32 years ago. She has never used smokeless tobacco. She reports current alcohol use. She reports that she does not use drugs. ETOH varies, not daily in general, not more than 7-10 in a week. Accountant. 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 5 grand children, 3 are local. Other's in [place].  Past Medical History:  Diagnosis Date  . Chicken pox   . Fibroid   . PONV (postoperative nausea and vomiting)   . Thyroid neoplasm     Past Surgical History:  Procedure Laterality Date  . ABLATION    . AUGMENTATION MAMMAPLASTY    . BREAST ENHANCEMENT SURGERY     2010  . BREAST SURGERY     breast bx 2015 benign breasts cysts   . FOOT SURGERY     left removed partial nerve   . THYROID LOBECTOMY Left 12/02/2017   Procedure: LEFT THYROID LOBECTOMY;  Surgeon: Armandina Gemma, MD;  Location: [facility];  Service: General;  Laterality: Left;  . THYROIDECTOMY N/A 12/10/2017   Procedure: COMPLETION THYROIDECTOMY;  Surgeon: Armandina Gemma, MD;  Location: [facility];  Service: General;  Laterality: N/A;  . TONSILLECTOMY    . TOTAL VAGINAL HYSTERECTOMY     fibroids     Current Outpatient Medications  Medication  Sig Dispense Refill  . levothyroxine (SYNTHROID) 100 MCG tablet Take 1 tablet (100 mcg total) by mouth daily. 30 tablet 3  . Multiple Vitamins-Minerals (CENTRUM SILVER 50+WOMEN) TABS Take 1 tablet by mouth daily.      No current facility-administered medications for this visit.     Family History  Problem Relation Age of Onset  . Diabetes Mother   . Heart disease Mother   . Depression Mother   . Alcohol abuse Father   . Cancer Father        mesthelioma house with asbestos   . Skin cancer Sister   . Skin cancer Brother   . Bladder Cancer Maternal Aunt   . Breast cancer Paternal Aunt     Review of Systems  Constitutional: Negative.   HENT: Negative.   Eyes: Negative.   Respiratory: Negative.   Cardiovascular: Negative.   Gastrointestinal: Negative.   Endocrine: Negative.   Genitourinary: Negative.   Musculoskeletal: Negative.   Skin: Negative.   Allergic/Immunologic: Negative.   Neurological: Negative.   Hematological: Negative.   Psychiatric/Behavioral: Negative.     Exam:   BP 122/64 (BP Location: Right Arm, Patient Position: Sitting, Cuff Size: Normal)   Pulse 64   Temp 97.9 F (36.6 C) (  Skin)   Ht 5' 8.5" (1.74 m)   Wt 167 lb 12.8 oz (76.1 kg)   BMI 25.14 kg/m   Weight change: @WEIGHTCHANGE @ Height:   Height: 5' 8.5" (174 cm)  Ht Readings from Last 3 Encounters:  09/01/18 5' 8.5" (1.74 m)  12/10/17 5\' 9"  (1.753 m)  12/02/17 5\' 9"  (1.753 m)    General appearance: alert, cooperative and appears stated age Head: Normocephalic, without obvious abnormality, atraumatic Neck: no adenopathy, supple, symmetrical, trachea midline and thyroid normal to inspection and palpation Lungs: clear to auscultation bilaterally Cardiovascular: regular rate and rhythm Breasts: normal appearance, no masses or tenderness Abdomen: soft, non-tender; non distended,  no masses,  no organomegaly Extremities: extremities normal, atraumatic, no cyanosis or edema Skin: Skin color,  texture, turgor normal. No rashes or lesions Lymph nodes: Cervical, supraclavicular, and axillary nodes normal. No abnormal inguinal nodes palpated Neurologic: Grossly normal   Pelvic: External genitalia:  no lesions              Urethra:  normal appearing urethra with no masses, tenderness or lesions              Bartholins and Skenes: normal                 Vagina: mildly atrophic appearing vagina with normal color and discharge, no lesions              Cervix: absent               Bimanual Exam:  Uterus:  uterus absent              Adnexa: no mass, fullness, tenderness               Rectovaginal: Confirms               Anus:  normal sphincter tone, no lesions  Chaperone was present for exam.  A:  Well Woman with normal exam  P:   No pap needed  Labs with primary next month  Discussed breast self exam  Discussed calcium and vit D intake  Mammogram next month  IFOB given, she will check on coverage of the cologuard

## 2018-09-01 NOTE — Patient Instructions (Signed)

## 2018-09-11 ENCOUNTER — Telehealth: Payer: Self-pay | Admitting: Obstetrics and Gynecology

## 2018-09-11 NOTE — Telephone Encounter (Signed)
Patient has decided to proceed with cologuard. She has spoke with her insurance to verify coverage.

## 2018-09-11 NOTE — Telephone Encounter (Signed)
Cologuard order faxed to [employer]. Patient notified. Patient is aware she will be contacted directly by [employer] to review benefits, collect and shipping. Our office will notify of results once completed. Patient verbalize understanding and is agreeable.   Encounter closed.

## 2018-09-11 NOTE — Telephone Encounter (Signed)
Per review of [facility] notes dated 09/01/18, discussed Cologuard with Dr. Talbert Nan.   Completed cologuard order to Dr. Talbert Nan for review and signature.

## 2018-09-15 DIAGNOSIS — E890 Postprocedural hypothyroidism: Secondary | ICD-10-CM | POA: Diagnosis not present

## 2018-09-17 IMAGING — CR DG CHEST 2V
2 series · 2 of 2 positions shown · non-contrast
Comparison: None.

CLINICAL DATA: Pre operative chest x-ray.

EXAM:
CHEST - 2 VIEW

[w chest pa]
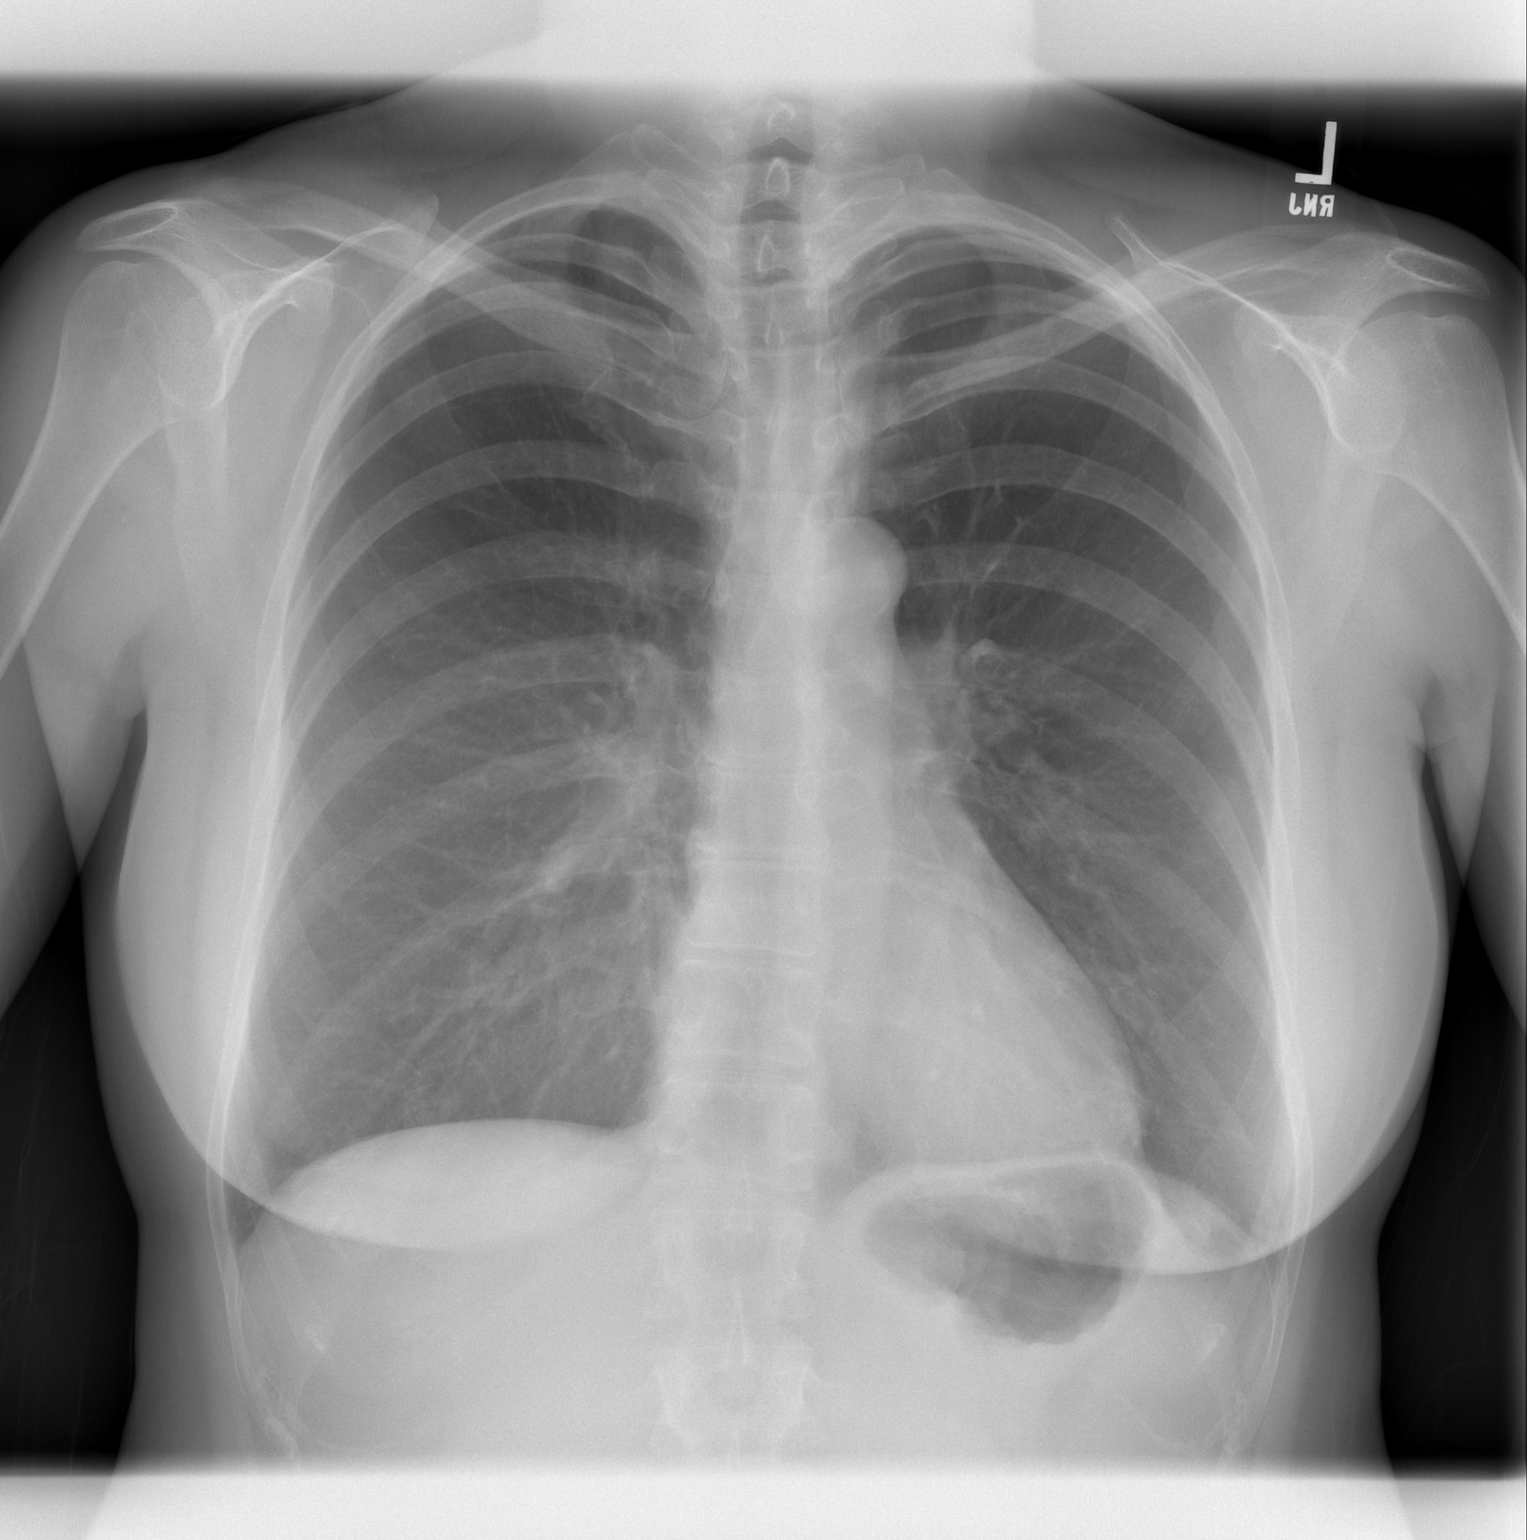

[w chest lat]
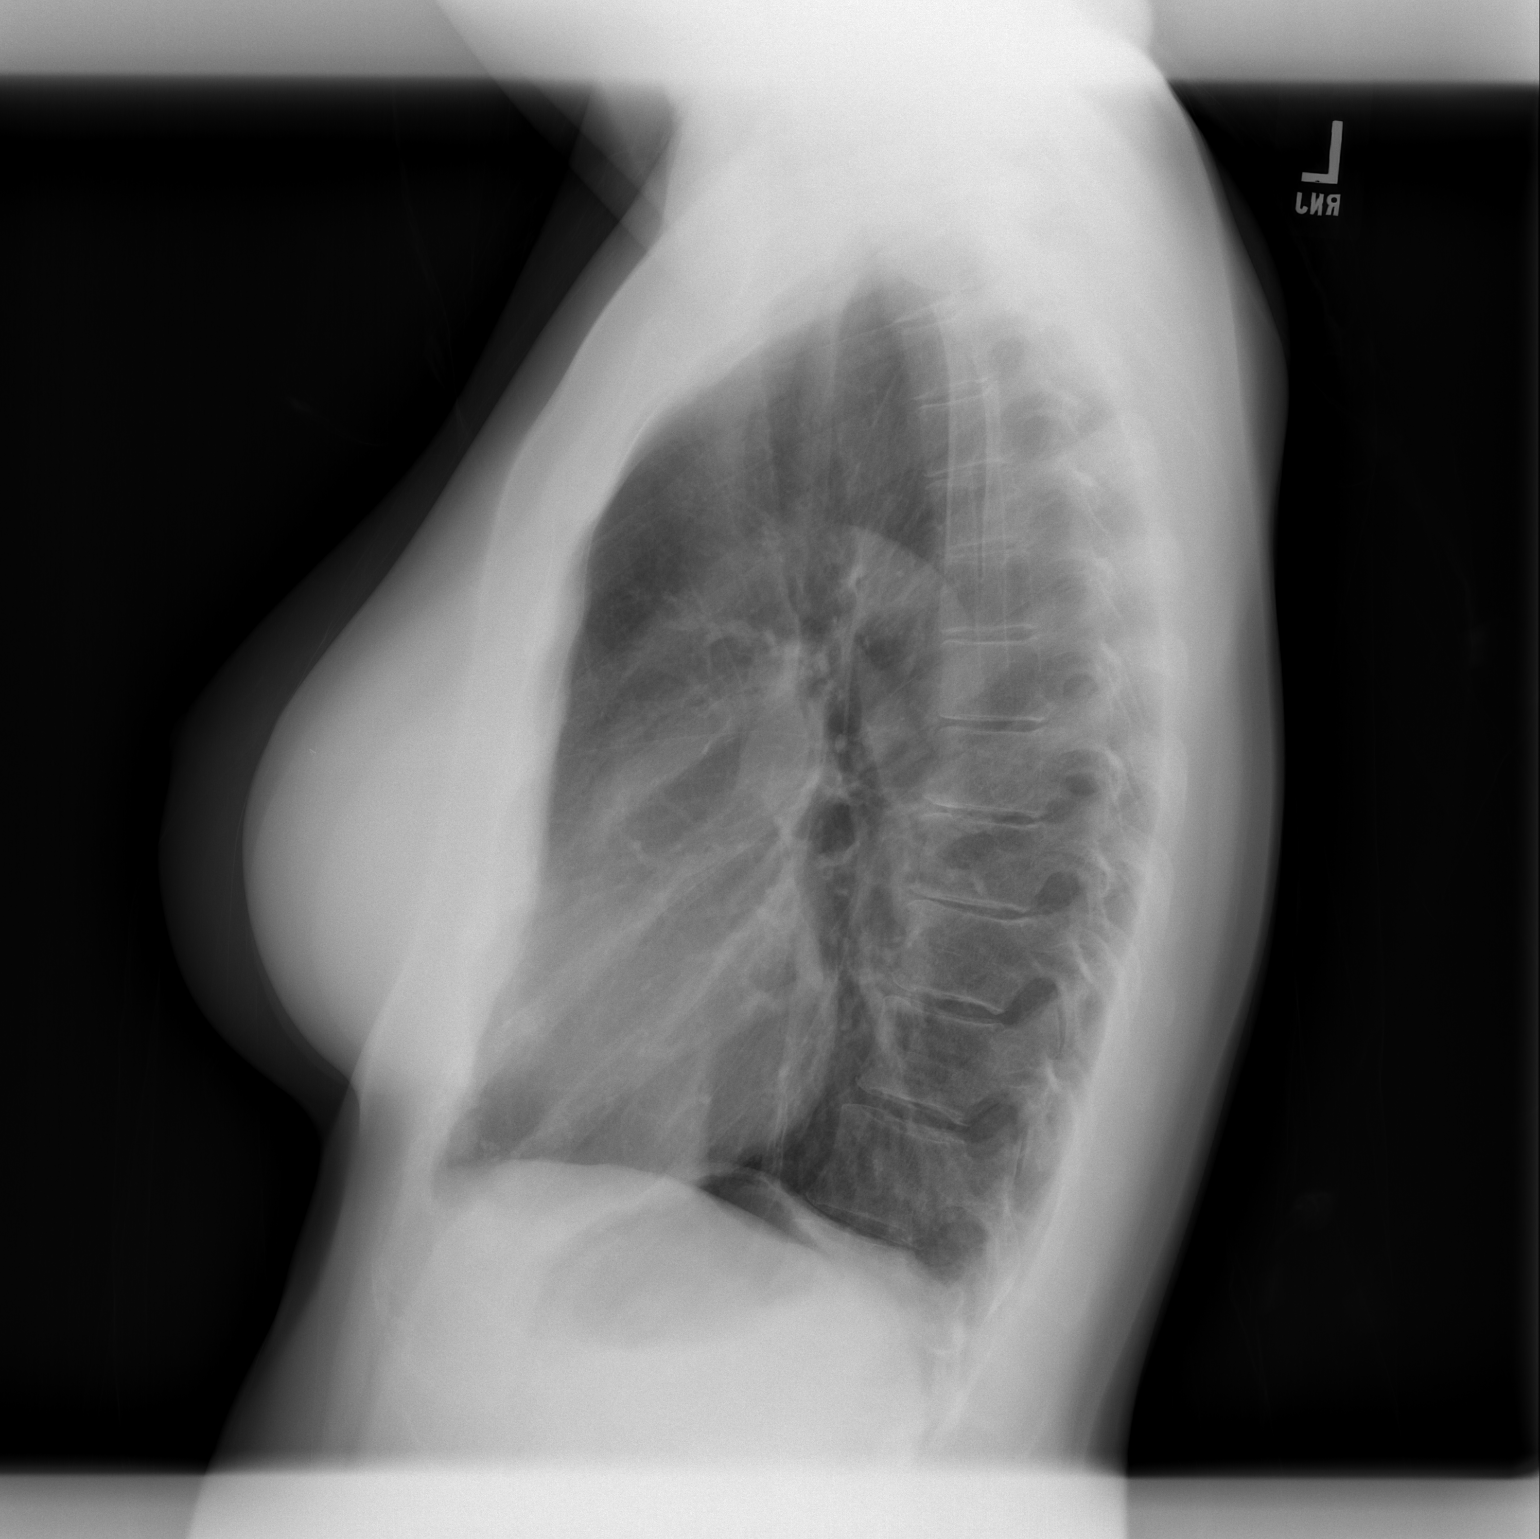

[2 of 2 positions shown; findings below may reference images not displayed]

FINDINGS: The lungs are clear without focal pneumonia, edema, pneumothorax or
pleural effusion. Mild hyperexpansion with attenuation of lung
markings in the upper lobes suggests underlying emphysema. The
cardiopericardial silhouette is within normal limits for size. The
visualized bony structures of the thorax are intact.
IMPRESSION: No acute cardiopulmonary findings.

## 2018-09-18 DIAGNOSIS — E890 Postprocedural hypothyroidism: Secondary | ICD-10-CM | POA: Diagnosis not present

## 2018-09-18 DIAGNOSIS — C73 Malignant neoplasm of thyroid gland: Secondary | ICD-10-CM | POA: Diagnosis not present

## 2018-09-24 DIAGNOSIS — Z1211 Encounter for screening for malignant neoplasm of colon: Secondary | ICD-10-CM | POA: Diagnosis not present

## 2018-09-24 DIAGNOSIS — Z1212 Encounter for screening for malignant neoplasm of rectum: Secondary | ICD-10-CM | POA: Diagnosis not present

## 2018-09-26 ENCOUNTER — Telehealth: Payer: Self-pay

## 2018-09-26 NOTE — Telephone Encounter (Signed)
Appointment cancelled

## 2018-09-26 NOTE — Telephone Encounter (Signed)
Copied from [name] [phone]. Topic: Appointment Scheduling - Scheduling Inquiry for Clinic >> Sep 26, 2018  9:37 AM Madison [name] wrote: Reason for CRM: patient called to cancel appt 7/21.  Patient did not want to reschedule at this time.

## 2018-10-07 ENCOUNTER — Encounter: Payer: BLUE CROSS/BLUE SHIELD | Admitting: Internal Medicine

## 2018-10-14 ENCOUNTER — Telehealth: Payer: Self-pay

## 2018-10-14 DIAGNOSIS — Z1212 Encounter for screening for malignant neoplasm of rectum: Secondary | ICD-10-CM

## 2018-10-14 DIAGNOSIS — Z1211 Encounter for screening for malignant neoplasm of colon: Secondary | ICD-10-CM

## 2018-10-14 LAB — COLOGUARD: Cologuard: NEGATIVE

## 2018-10-14 NOTE — Telephone Encounter (Signed)
Spoke with patient. Advised Cologuard testing is negative. Results to scan. Encounter closed.

## 2018-10-23 DIAGNOSIS — Z23 Encounter for immunization: Secondary | ICD-10-CM | POA: Diagnosis not present

## 2018-11-01 IMAGING — NM NM [ID] THYROID CANCER METS SP CA TX
2 series · 6 of 6 positions shown · non-contrast
Comparison: None.

CLINICAL DATA: 57-year-old female with 3.5 cm Hurthle carcinoma
confined within
thyroid gland.  Remnant ablation following
thyroidectomy.. Intermediate risk.

EXAM:
NUCLEAR MEDICINE 4-S4S POST THERAPY WHOLE BODY SCAN
TECHNIQUE: The patient received 75 mCi 4-S4S sodium iodide for the treatment of
thyroid cancer within the past 10 days. The patient returns today,
and whole body scanning was performed in the anterior and posterior
projections.

[Series 1000: (id) wb scan · 2.40mm/px · 2 of 2 frames shown]
[frame 1/2]
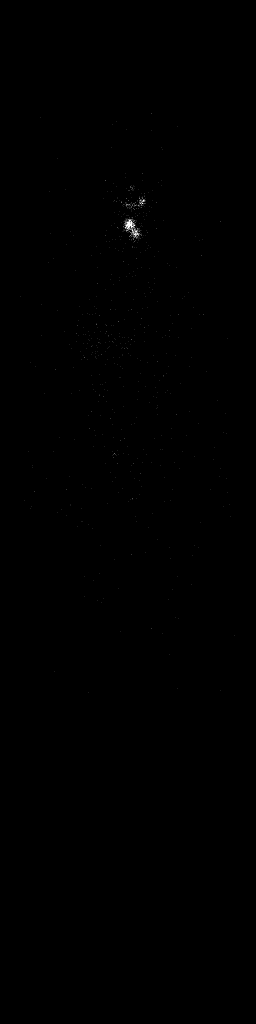
[frame 2/2]
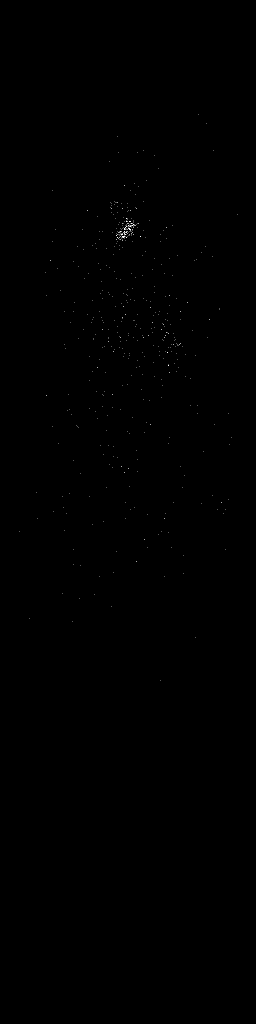

[Series 1000: (id) statics · 2.40mm/px · 2 acquisitions, 4 frames shown]
[im 1/2]
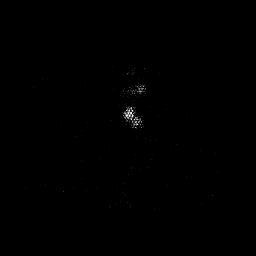
[im 1/2]
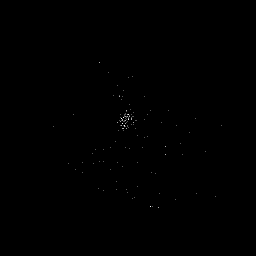
[im 2/2]
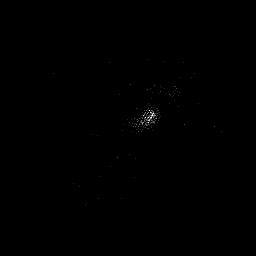
[im 2/2]
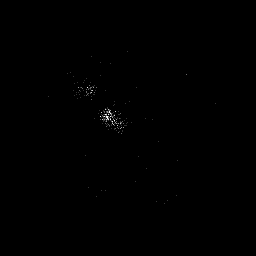

[6 of 6 positions shown; findings below may reference images not displayed]

FINDINGS: Two foci of activity within the thyroid bed presumably represent
remnant thyroid tissue. Cannot exclude lymph node metastasis.

No evidence of distant metastatic disease.
IMPRESSION: No evidence of distant metastatic disease.

Thyroid remnant activity in the thyroid bed plus or minus lymph node
metastasis.

## 2018-11-17 ENCOUNTER — Ambulatory Visit (INDEPENDENT_AMBULATORY_CARE_PROVIDER_SITE_OTHER): Payer: BC Managed Care – PPO | Admitting: Podiatry

## 2018-11-17 ENCOUNTER — Encounter: Payer: Self-pay | Admitting: Podiatry

## 2018-11-17 ENCOUNTER — Other Ambulatory Visit: Payer: Self-pay

## 2018-11-17 ENCOUNTER — Ambulatory Visit (INDEPENDENT_AMBULATORY_CARE_PROVIDER_SITE_OTHER): Payer: BC Managed Care – PPO

## 2018-11-17 DIAGNOSIS — M778 Other enthesopathies, not elsewhere classified: Secondary | ICD-10-CM

## 2018-11-17 DIAGNOSIS — G5791 Unspecified mononeuropathy of right lower limb: Secondary | ICD-10-CM | POA: Diagnosis not present

## 2018-11-17 DIAGNOSIS — M779 Enthesopathy, unspecified: Secondary | ICD-10-CM

## 2018-11-17 DIAGNOSIS — M7751 Other enthesopathy of right foot: Secondary | ICD-10-CM | POA: Diagnosis not present

## 2018-11-17 NOTE — Progress Notes (Signed)
Subjective:  Patient ID: Madison [name], female    DOB: [birth year],  MRN: [id number] HPI Chief Complaint  Patient presents with  . Foot Pain    Patient presents today for right 5th met bunion pain x 2-3 months.  She states "it feels almost like a bruise and pain shoots to my heel at random times"  She has not done anything for treatment"    58 y.o. female presents with the above complaint.   ROS: Denies fever chills nausea vomiting muscle aches pains calf pain back pain chest pain shortness of breath.  Past Medical History:  Diagnosis Date  . Chicken pox   . Fibroid   . PONV (postoperative nausea and vomiting)   . Thyroid neoplasm    Past Surgical History:  Procedure Laterality Date  . ABLATION    . AUGMENTATION MAMMAPLASTY    . BREAST ENHANCEMENT SURGERY     2010  . BREAST SURGERY     breast bx 2015 benign breasts cysts   . FOOT SURGERY     left removed partial nerve   . THYROID LOBECTOMY Left 12/02/2017   Procedure: LEFT THYROID LOBECTOMY;  Surgeon: Armandina Gemma, MD;  Location: [facility];  Service: General;  Laterality: Left;  . THYROIDECTOMY N/A 12/10/2017   Procedure: COMPLETION THYROIDECTOMY;  Surgeon: Armandina Gemma, MD;  Location: [facility];  Service: General;  Laterality: N/A;  . TONSILLECTOMY    . TOTAL VAGINAL HYSTERECTOMY     fibroids     Current Outpatient Medications:  .  levothyroxine (SYNTHROID) 100 MCG tablet, Take 1 tablet (100 mcg total) by mouth daily., Disp: 30 tablet, Rfl: 3 .  Multiple Vitamins-Minerals (CENTRUM SILVER 50+WOMEN) TABS, Take 1 tablet by mouth daily. , Disp: , Rfl:   Allergies  Allergen Reactions  . Adhesive [Tape]     Had moles removes on back and when they put bandage over it , it caused bumpiness underneath the bandage    Review of Systems Objective:  There were no vitals filed for this visit.  General: Well developed, nourished, in no acute distress, alert and oriented x3   Dermatological: Skin is warm, dry and supple bilateral. Nails  x 10 are well maintained; remaining integument appears unremarkable at this time. There are no open sores, no preulcerative lesions, no rash or signs of infection present.  Vascular: Dorsalis Pedis artery and Posterior Tibial artery pedal pulses are 2/4 bilateral with immedate capillary fill time. Pedal hair growth present. No varicosities and no lower extremity edema present bilateral.   Neruologic: Grossly intact via light touch bilateral. Vibratory intact via tuning fork bilateral. Protective threshold with Semmes Wienstein monofilament intact to all pedal sites bilateral. Patellar and Achilles deep tendon reflexes 2+ bilateral. No Babinski or clonus noted bilateral.   Musculoskeletal: No gross boney pedal deformities bilateral. No pain, crepitus, or limitation noted with foot and ankle range of motion bilateral. Muscular strength 5/5 in all groups tested bilateral.  Palpable fluctuant mass beneath the tenderness but deformity of the fifth metatarsal area right foot.  Palpable plantar lateral nerve.  Gait: Unassisted, Nonantalgic.    Radiographs:  Radiographs taken today demonstrate a mild tailor's bunion deformity with some soft tissue swelling around the plantar lateral aspect of the metatarsal phalangeal joint.  Assessment & Plan:   Assessment: Capsulitis neuritis fifth metatarsal phalangeal joint with talar bunion deformity.  Plan: Discussed the possible need for surgical intervention.  Also injected into the bursa today 2 mg of dexamethasone and local anesthetic after  sterile Betadine skin prep.  Tolerated procedure well without complications.     Zameria Vogl T. Paac [place], [place]

## 2018-11-20 DIAGNOSIS — E890 Postprocedural hypothyroidism: Secondary | ICD-10-CM | POA: Diagnosis not present

## 2018-11-20 DIAGNOSIS — C73 Malignant neoplasm of thyroid gland: Secondary | ICD-10-CM | POA: Diagnosis not present

## 2018-12-17 DIAGNOSIS — H6123 Impacted cerumen, bilateral: Secondary | ICD-10-CM | POA: Diagnosis not present

## 2018-12-17 DIAGNOSIS — H60333 Swimmer's ear, bilateral: Secondary | ICD-10-CM | POA: Diagnosis not present

## 2018-12-19 ENCOUNTER — Other Ambulatory Visit: Payer: Self-pay

## 2018-12-23 ENCOUNTER — Ambulatory Visit (INDEPENDENT_AMBULATORY_CARE_PROVIDER_SITE_OTHER): Payer: BC Managed Care – PPO | Admitting: Internal Medicine

## 2018-12-23 ENCOUNTER — Encounter: Payer: Self-pay | Admitting: Internal Medicine

## 2018-12-23 ENCOUNTER — Other Ambulatory Visit: Payer: Self-pay

## 2018-12-23 VITALS — BP 124/70 | HR 75 | Ht 68.5 in | Wt 167.6 lb

## 2018-12-23 DIAGNOSIS — E890 Postprocedural hypothyroidism: Secondary | ICD-10-CM

## 2018-12-23 DIAGNOSIS — C73 Malignant neoplasm of thyroid gland: Secondary | ICD-10-CM | POA: Diagnosis not present

## 2018-12-23 LAB — T4, FREE: Free T4: 1.07 ng/dL (ref 0.60–1.60)

## 2018-12-23 LAB — TSH: TSH: 1.74 u[IU]/mL (ref 0.35–4.50)

## 2018-12-23 NOTE — Progress Notes (Signed)
Patient ID: Madison [name], female   DOB: 11-Mar-1961, 58 y.o.   MRN: [id number]   HPI  Madison [name] is a 58 y.o.-year-old female, referred by Dr. 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  She would like to establish care with me.  She is was seeing Dr. Gabriel Carina before.  She felt a nodule in her neck 03/2017.  She presented to see her PCP who ordered a thyroid ultrasound.  A large thyroid nodule was seen in the left neck.  This was biopsied and the lesion was inconclusive.  She was then sent for left hemithyroidectomy which showed Hurthle cell cancer.  Reviewed patient's chart and Dr. Joycie Peek notes: 09/10/2017: FNA of the left thyroid nodule: Cystic components (scant cellular material), inconclusive 12/02/2017: Left hemithyroidectomy-Dr. Harlow Asa. Pathology: 3.5 x 2.6 x 2.2 cm Hurthle cell carcinoma with negative margins and no lymphatic invasion but with angioinvasion.  No lymph nodes were submitted then.  Staging: pT2Nx 12/11/2017: Total thyroidectomy-Dr. Harlow Asa Pathology: Right lobe benign 01/06/2018: RAI treatment with 75 mCi Post ablative WBS: 2 foci of uptake in the thyroid bed, no distant uptake 05/29/2018: Neck ultrasound: Normal changes after thyroidectomy without significant anterior neck adenopathy  Thyroglobulin levels and antibodies have been undetectable: 02/18/2018: Tg <0.1, ATA <1 05/28/2018: Tg <0.1, ATA <1  Pt denies: - feeling nodules in neck - hoarseness - dysphagia - choking - SOB with lying down  For her postsurgical hypothyroidism, she is on Levothyroxine 100 mcg, taken: - fasting - with water - separated by 1h from b'fast  - no calcium, iron, PPIs - + multivitamins at night - occasionally - + vit D  I reviewed pt's thyroid tests: 09/15/2018: TSH 1.738 05/28/2018: TSH 0.907 02/18/2018: TSH 0.352 Lab Results  Component Value Date   TSH 2.03 07/18/2017   FREET4 0.83 07/18/2017     Pt denies: - no fatigue now - + weight  gain - + less patience - + occasional palpitations  She denies: - cold intolerance - constipation - dry skin - hair loss - depression  She has no FH of thyroid disorders. No FH of thyroid cancer.  No h/o radiation tx to head or neck. + RxTx as a child after an infected vaccine wound.   ROS: Constitutional: + weight gain/loss, no fatigue, no subjective hyperthermia/hypothermia Eyes: no blurry vision, no xerophthalmia ENT: no sore throat, + see HPI Cardiovascular: no CP/SOB/+ palpitations/no leg swelling Respiratory: no cough/SOB Gastrointestinal: no N/V/D/C Musculoskeletal: no muscle/joint aches Skin: no rashes Neurological: no tremors/numbness/tingling/dizziness Psychiatric: no depression/anxiety  Past Medical History:  Diagnosis Date  . Chicken pox   . Fibroid   . PONV (postoperative nausea and vomiting)   . Thyroid neoplasm    Past Surgical History:  Procedure Laterality Date  . ABLATION    . AUGMENTATION MAMMAPLASTY    . BREAST ENHANCEMENT SURGERY     2010  . BREAST SURGERY     breast bx 2015 benign breasts cysts   . FOOT SURGERY     left removed partial nerve   . THYROID LOBECTOMY Left 12/02/2017   Procedure: LEFT THYROID LOBECTOMY;  Surgeon: Armandina Gemma, MD;  Location: [facility];  Service: General;  Laterality: Left;  . THYROIDECTOMY N/A 12/10/2017   Procedure: COMPLETION THYROIDECTOMY;  Surgeon: Armandina Gemma, MD;  Location: [facility];  Service: General;  Laterality: N/A;  . TONSILLECTOMY    . TOTAL VAGINAL HYSTERECTOMY     fibroids    Social History   Socioeconomic History  .  Marital status: Divorced    Spouse name: Not on file  . Number of children: Not on file  . Years of education: Not on file  . Highest education level: Not on file  Occupational History  . Not on file  Social Needs  . Financial resource strain: Not on file  . Food insecurity    Worry: Not on file    Inability: Not on file  . Transportation needs    Medical: Not on file     Non-medical: Not on file  Tobacco Use  . Smoking status: Former Smoker    Quit date: 1988    Years since quitting: 32.7  . Smokeless tobacco: Never Used  . Tobacco comment: smoked 13 years ago 1/2 ppd   Substance and Sexual Activity  . Alcohol use: Yes    Comment: occasionally   . Drug use: Never  . Sexual activity: Not Currently    Birth control/protection: Surgical    Comment: Hysterectomy 2008   Lifestyle  . Physical activity    Days per week: Not on file    Minutes per session: Not on file  . Stress: Not on file  Relationships  . Social Herbalist on phone: Not on file    Gets together: Not on file    Attends religious service: Not on file    Active member of club or organization: Not on file    Attends meetings of clubs or organizations: Not on file    Relationship status: Not on file  . Intimate partner violence    Fear of current or ex partner: Not on file    Emotionally abused: Not on file    Physically abused: Not on file    Forced sexual activity: Not on file  Other Topics Concern  . 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 levothyroxine (SYNTHROID) 100 MCG tablet Take 1 tablet (100 mcg total) by mouth daily. 30 tablet 3   No current facility-administered medications on file prior to visit.    Allergies  Allergen Reactions  . Adhesive [Tape]     Had moles removes on back and when they put bandage over it , it caused bumpiness underneath the bandage    Family History  Problem Relation Age of Onset  . Diabetes Mother   . Heart disease Mother   . Depression Mother   . Alcohol abuse Father   . Cancer Father        mesthelioma house with asbestos   . Skin cancer Sister   . Skin cancer Brother   . Bladder Cancer Maternal Aunt   . Breast cancer Paternal Aunt     PE: BP 124/70 (BP Location: Right Arm, Patient Position: Sitting, Cuff Size: Normal)   Pulse 75   Ht 5' 8.5" (1.74 m)   Wt 167 lb 9.6 oz (76 kg)   SpO2 98%   BMI 25.11 kg/m  Wt Readings from Last 3 Encounters:  12/23/18 167 lb 9.6 oz (76 kg)  09/01/18 167 lb 12.8 oz (76.1 kg)  12/10/17 160 lb (72.6 kg)   Constitutional: normal weight, in NAD Eyes: PERRLA, EOMI, no exophthalmos ENT: moist mucous membranes, no neck masses, thyroidectomy scar healed, no  neck mass palpated, no cervical lymphadenopathy Cardiovascular: RRR, No MRG Respiratory: CTA B Gastrointestinal: abdomen soft, NT, ND, BS+ Musculoskeletal: no deformities, strength intact in all 4 Skin: moist, warm, no rashes Neurological: no tremor with outstretched hands, DTR normal in all 4  ASSESSMENT: 1. Thyroid cancer (Hurthle cell) - see HPI  2. Postsurgical Hypothyroidism  PLAN:  1.  Hurthle cell thyroid cancer - I had a long discussion with the patient about her relatively recent diagnosis of thyroid cancer. We reviewed together the pathology.  She did not have lymphatic invasion but did have angioinvasion.  Because of this, she is a slightly higher risk for metastasis.  Also, Hurthle thyroid cancer is a more unusual type of cancer, which may be less responsive to RAI treatment, and less well detected by thyroglobulin levels.  However, in general, thyroid cancer is a slow growing cancer with good prognosis. - Since we are now at the year after her RAI treatment and her thyroid cancer is a little higher risk than papillary thyroid cancer, we will go ahead and get another whole-body scan now.  We discussed that afterwards, we can follow her with neck ultrasounds, if needed. - She has no neck masses on palpation and her thyroidectomy scar is healed well - we will check a thyroglobulin and Tg antibodies today - will use the [facility] assay since this assay was used in the past for her to check these - I will see the patient back  in 6 months  2. Patient with h/o total thyroidectomy for cancer, now with iatrogenic hypothyroidism, on levothyroxine therapy.  - She appears euthyroid but does have occasional palpitations and lack of patience - We discussed about correct intake of levothyroxine, fasting, with water, separated by at least 30 minutes from breakfast, and separated by more than 4 hours from calcium, iron, multivitamins, acid reflux medications (PPIs).  She is taking it correctly. - will check thyroid tests today: TSH, free T4 - target TSH: LLN - If these are abnormal, she will need to return in 5-6 weeks for repeat labs - If these are normal, we will recheck them at next visit  Orders Placed This Encounter  Procedures  . NM Whole Body I131 Scan W/Thyrogen  . TSH  . T4, free  . Thyroglobulin antibody  . Thyroglobulin Level   Component     Latest Ref Rng & Units 12/23/2018  TSH     0.35 - 4.50 uIU/mL 1.74  T4,Free(Direct)     0.60 - 1.60 ng/dL 1.07  Thyroglobulin Antibody     0.0 - 0.9 IU/mL <1.0  THYROGLOBULIN (TG-RIA)     ng/mL <2.0   Excellent thyroid tests.  Philemon Kingdom, MD PhD [facility] Endocrinology

## 2018-12-23 NOTE — Patient Instructions (Signed)
Please stop at the lab.  Please continue Levothyroxine 100 Mcg daily.  Take the thyroid hormone every day, with water, at least 30 minutes before breakfast, separated by at least 4 hours from: - acid reflux medications - calcium - iron - multivitamins  Let me know if you are not called to schedule the Whole Body Scan within 1 week.  Please come back for a follow-up appointment in 6 months.

## 2018-12-28 LAB — THYROGLOBULIN LEVEL: Thyroglobulin (TG-RIA): <2 ng/mL

## 2018-12-28 LAB — THYROGLOBULIN ANTIBODY: Thyroglobulin Antibody: <1 IU/mL (ref 0.0–0.9)

## 2019-01-07 ENCOUNTER — Telehealth: Payer: Self-pay

## 2019-01-07 NOTE — Telephone Encounter (Signed)
patient calling back in because Dr told her to call back in if she had not heard anything from [place] to schedule Whole body scan within a week. And it has been a week plus a couple of days

## 2019-01-08 NOTE — Telephone Encounter (Signed)
Lattie Haw, can you look into this for Dr. Cruzita Lederer please? Thank you!

## 2019-01-09 NOTE — Telephone Encounter (Signed)
Pre-cert complete and this has been sent to [facility] for scheduling

## 2019-01-21 DIAGNOSIS — Z713 Dietary counseling and surveillance: Secondary | ICD-10-CM | POA: Diagnosis not present

## 2019-02-06 DIAGNOSIS — Z713 Dietary counseling and surveillance: Secondary | ICD-10-CM | POA: Diagnosis not present

## 2019-02-16 ENCOUNTER — Ambulatory Visit (HOSPITAL_COMMUNITY)
Admission: RE | Admit: 2019-02-16 | Discharge: 2019-02-16 | Disposition: A | Discharge status: Home / Self Care | Payer: BC Managed Care – PPO | Source: Ambulatory Visit | Attending: Internal Medicine | Admitting: Internal Medicine

## 2019-02-16 ENCOUNTER — Other Ambulatory Visit: Payer: Self-pay

## 2019-02-16 DIAGNOSIS — C73 Malignant neoplasm of thyroid gland: Secondary | ICD-10-CM | POA: Insufficient documentation

## 2019-02-16 MED ORDER — THYROTROPIN ALFA 1.1 MG IM SOLR: 0.9000 mg | INTRAMUSCULAR | Status: DC

## 2019-02-16 MED ORDER — THYROTROPIN ALFA 1.1 MG IM SOLR
INTRAMUSCULAR | Status: AC
  Administered 2019-02-16: 0.9 mg via INTRAMUSCULAR
  Filled 2019-02-16: qty 0.9

## 2019-02-17 ENCOUNTER — Encounter (HOSPITAL_COMMUNITY)
Admission: RE | Admit: 2019-02-17 | Discharge: 2019-02-17 | Disposition: A | Discharge status: Home / Self Care | Payer: BC Managed Care – PPO | Source: Ambulatory Visit | Attending: Internal Medicine | Admitting: Internal Medicine

## 2019-02-17 DIAGNOSIS — C73 Malignant neoplasm of thyroid gland: Secondary | ICD-10-CM | POA: Diagnosis not present

## 2019-02-17 MED ORDER — THYROTROPIN ALFA 1.1 MG IM SOLR
0.9000 mg | INTRAMUSCULAR | Status: AC
  Administered 2019-02-17: 0.9 mg via INTRAMUSCULAR

## 2019-02-17 MED ORDER — THYROTROPIN ALFA 1.1 MG IM SOLR
INTRAMUSCULAR | Status: AC
  Filled 2019-02-17: qty 0.9

## 2019-02-18 ENCOUNTER — Encounter (HOSPITAL_COMMUNITY)
Admission: RE | Admit: 2019-02-18 | Discharge: 2019-02-18 | Disposition: A | Discharge status: Home / Self Care | Payer: BC Managed Care – PPO | Source: Ambulatory Visit | Attending: Internal Medicine | Admitting: Internal Medicine

## 2019-02-18 ENCOUNTER — Other Ambulatory Visit: Payer: Self-pay

## 2019-02-18 DIAGNOSIS — C73 Malignant neoplasm of thyroid gland: Secondary | ICD-10-CM | POA: Diagnosis not present

## 2019-02-18 MED ORDER — SODIUM IODIDE I 131 CAPSULE
4.1000 | Freq: Once | INTRAVENOUS | Status: AC | PRN
  Administered 2019-02-18: 4.1 via ORAL

## 2019-02-20 ENCOUNTER — Encounter (HOSPITAL_COMMUNITY)
Admission: RE | Admit: 2019-02-20 | Discharge: 2019-02-20 | Disposition: A | Discharge status: Home / Self Care | Payer: BC Managed Care – PPO | Source: Ambulatory Visit | Attending: Internal Medicine | Admitting: Internal Medicine

## 2019-02-20 ENCOUNTER — Other Ambulatory Visit: Payer: Self-pay

## 2019-02-20 DIAGNOSIS — C73 Malignant neoplasm of thyroid gland: Secondary | ICD-10-CM | POA: Diagnosis not present

## 2019-02-27 DIAGNOSIS — Z713 Dietary counseling and surveillance: Secondary | ICD-10-CM | POA: Diagnosis not present

## 2019-03-06 DIAGNOSIS — Z713 Dietary counseling and surveillance: Secondary | ICD-10-CM | POA: Diagnosis not present

## 2019-03-27 DIAGNOSIS — Z713 Dietary counseling and surveillance: Secondary | ICD-10-CM | POA: Diagnosis not present

## 2019-04-03 DIAGNOSIS — Z713 Dietary counseling and surveillance: Secondary | ICD-10-CM | POA: Diagnosis not present

## 2019-06-22 ENCOUNTER — Other Ambulatory Visit: Payer: Self-pay

## 2019-06-23 ENCOUNTER — Encounter: Payer: Self-pay | Admitting: Internal Medicine

## 2019-06-23 ENCOUNTER — Ambulatory Visit (INDEPENDENT_AMBULATORY_CARE_PROVIDER_SITE_OTHER): Payer: BC Managed Care – PPO | Admitting: Internal Medicine

## 2019-06-23 VITALS — BP 110/70 | HR 65 | Ht 68.5 in | Wt 168.0 lb

## 2019-06-23 DIAGNOSIS — C73 Malignant neoplasm of thyroid gland: Secondary | ICD-10-CM | POA: Diagnosis not present

## 2019-06-23 DIAGNOSIS — E890 Postprocedural hypothyroidism: Secondary | ICD-10-CM | POA: Diagnosis not present

## 2019-06-23 LAB — T4, FREE: Free T4: 1.05 ng/dL (ref 0.60–1.60)

## 2019-06-23 LAB — TSH: TSH: 0.74 u[IU]/mL (ref 0.35–4.50)

## 2019-06-23 NOTE — Progress Notes (Signed)
Patient ID: Madison [name], female   DOB: 1960/09/18, 59 y.o.   MRN: [id number]  This visit occurred during the SARS-CoV-2 public health emergency.  Safety protocols were in place, including screening questions prior to the visit, additional usage of staff PPE, and extensive cleaning of exam room while observing appropriate contact time as indicated for disinfecting solutions.   HPI  Madison [name] is a 59 y.o.-year-old female, initially referred by Dr. 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  She is was seeing Dr. Gabriel Carina before.  Last visit with me 6 months ago.  Reviewed and addended history: She felt a nodule in her neck 03/2017.  She presented to see her PCP who ordered a thyroid ultrasound.  A large thyroid nodule was seen in the left neck.  This was biopsied and the lesion was inconclusive.  She was then sent for left hemithyroidectomy which showed Hurthle cell cancer.  Reviewed patient's chart and Dr. Joycie Peek notes: 09/10/2017: FNA of the left thyroid nodule: Cystic components (scant cellular material), inconclusive 12/02/2017: Left hemithyroidectomy-Dr. Harlow Asa. Pathology: 3.5 x 2.6 x 2.2 cm Hurthle cell carcinoma with negative margins and no lymphatic invasion but with angioinvasion.  No lymph nodes were submitted then.  Staging: pT2Nx 12/11/2017: Total thyroidectomy-Dr. Harlow Asa Pathology: Right lobe benign 01/06/2018: RAI treatment with 75 mCi Post ablative WBS: 2 foci of uptake in the thyroid bed, no distant uptake 05/29/2018: Neck ultrasound: Normal changes after thyroidectomy without significant anterior neck adenopathy 02/16/2019: Whole-body scan: interval resolution of activity in the thyroid bed, no metastases  Her thyroglobulin levels and antibodies have been undetectable: 02/18/2018: Tg <0.1, ATA <1 05/28/2018: Tg <0.1, ATA <1  12/23/2018: Tg <2, ATA <1   Pt denies: - feeling nodules in neck - hoarseness - dysphagia -  choking - SOB with lying down  Postsurgical hypothyroidism  Pt is on levothyroxine 100 mcg daily, taken: - in am - fasting - at least 30 min from b'fast - no Ca, Fe,  PPIs, + multivitamins at night - not on Biotin She is also on vitamin D.  Reviewed her TFTs: Lab Results  Component Value Date   TSH 1.74 12/23/2018   TSH 2.03 07/18/2017   FREET4 1.07 12/23/2018   FREET4 0.83 07/18/2017  09/15/2018: TSH 1.738 05/28/2018: TSH 0.907 02/18/2018: TSH 0.352    At last visit, she described some weight gain, irrascibility and occasional palpitations.  She still has occasional palpitations.  No family history of thyroid disorders or thyroid cancer No history of radiation therapy to head or neck.  She had radiotherapy as a child after an infected vaccine wound.   ROS: Constitutional: no weight gain/no weight loss, no fatigue, no subjective hyperthermia, no subjective hypothermia Eyes: no blurry vision, no xerophthalmia ENT: no sore throat, + see HPI Cardiovascular: no CP/no SOB/+ palpitations/no leg swelling Respiratory: no cough/no SOB/no wheezing Gastrointestinal: no N/no V/no D/no C/no acid reflux Musculoskeletal: no muscle aches/no joint aches Skin: no rashes, no hair loss Neurological: no tremors/no numbness/no tingling/no dizziness  I reviewed pt's medications, allergies, PMH, social hx, family hx, and changes were documented in the history of present illness. Otherwise, unchanged from my initial visit note.  Past Medical History:  Diagnosis Date  . Chicken pox   . Fibroid   . PONV (postoperative nausea and vomiting)   . Thyroid neoplasm    Past Surgical History:  Procedure Laterality Date  . ABLATION    . AUGMENTATION MAMMAPLASTY    . BREAST  ENHANCEMENT SURGERY     2010  . BREAST SURGERY     breast bx 2015 benign breasts cysts   . FOOT SURGERY     left removed partial nerve   . THYROID LOBECTOMY Left 12/02/2017   Procedure: LEFT THYROID LOBECTOMY;  Surgeon: Armandina Gemma, MD;  Location: [facility];  Service: General;  Laterality: Left;  . THYROIDECTOMY N/A 12/10/2017   Procedure: COMPLETION THYROIDECTOMY;  Surgeon: Armandina Gemma, MD;  Location: [facility];  Service: General;  Laterality: N/A;  . TONSILLECTOMY    . TOTAL VAGINAL HYSTERECTOMY     fibroids    Social History   Socioeconomic History  . Marital status: Divorced    Spouse name: Not on file  . Number of children: Not on file  . Years of education: Not on file  . Highest education level: Not on file  Occupational History  . Not on file  Tobacco Use  . Smoking status: Former Smoker    Quit date: 1988    Years since quitting: 33.2  . Smokeless tobacco: Never Used  . Tobacco comment: smoked 13 years ago 1/2 ppd   Substance and Sexual Activity  . Alcohol use: Yes    Comment: occasionally   . Drug use: Never  . Sexual activity: Not Currently    Birth control/protection: Surgical    Comment: Hysterectomy 2008   Other Topics Concern  . 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 Difficulty of Paying Living Expenses:   Food Insecurity:   . Worried About Charity fundraiser in the Last Year:   . Arboriculturist in the Last Year:   Transportation Needs:   . Film/video editor (Medical):   Marland Kitchen Lack of Transportation (Non-Medical):   Physical Activity:   . Days of Exercise per Week:   . Minutes of Exercise per Session:   Stress:   . Feeling of Stress :   Social Connections:   . Frequency of Communication with Friends and Family:   . Frequency of Social Gatherings with Friends and Family:   . Attends Religious Services:   . Active Member of Clubs or Organizations:   . Attends Archivist Meetings:   Marland Kitchen Marital Status:   Intimate Partner Violence:   . Fear of Current or Ex-Partner:   . Emotionally Abused:   Marland Kitchen Physically  Abused:   . Sexually Abused:    Current Outpatient Medications on File Prior to Visit  Medication Sig Dispense Refill  . levothyroxine (SYNTHROID) 100 MCG tablet Take 1 tablet (100 mcg total) by mouth daily. 30 tablet 3   No current facility-administered medications on file prior to visit.   Allergies  Allergen Reactions  . Adhesive [Tape]     Had moles removes on back and when they put bandage over it , it caused bumpiness underneath the bandage    Family History  Problem Relation Age of Onset  . Diabetes Mother   . Heart disease Mother   . Depression Mother   . Alcohol abuse Father   . Cancer Father        mesthelioma house with asbestos   . Skin cancer Sister   . Skin cancer Brother   . Bladder Cancer  Maternal Aunt   . Breast cancer Paternal Aunt    PE: BP 110/70   Pulse 65   Ht 5' 8.5" (1.74 m)   Wt 168 lb (76.2 kg)   SpO2 97%   BMI 25.17 kg/m  Wt Readings from Last 3 Encounters:  06/23/19 168 lb (76.2 kg)  12/23/18 167 lb 9.6 oz (76 kg)  09/01/18 167 lb 12.8 oz (76.1 kg)   Constitutional: Normal weight, in NAD Eyes: PERRLA, EOMI, no exophthalmos ENT: moist mucous membranes, no neck masses palpated, + thyroidectomy scar healed, no cervical lymphadenopathy Cardiovascular: RRR, No MRG Respiratory: CTA B Gastrointestinal: abdomen soft, NT, ND, BS+ Musculoskeletal: no deformities, strength intact in all 4 Skin: moist, warm, no rashes Neurological: no tremor with outstretched hands, DTR normal in all 4  ASSESSMENT: 1. Thyroid cancer (Hurthle cell) - see HPI  2. Postsurgical Hypothyroidism  PLAN:  1.  Hurthle cell thyroid cancer - Patient has a history of Hurthle cell thyroid cancer without lymphatic invasion but with angioinvasion.  Because of this, she is a slightly higher risk of metastasis.  Also, Hurthle thyroid cancer is a more unusual type of cancer, which may be less responsive to RAI treatment, and less well detected by thyroglobulin levels.  However,  in general, thyroid cancer is a slow-growing cancer, with good prognosis.  Due to the slightly higher risk compared with classic papillary thyroid cancer, at last visit, we ordered a whole-body scan and this was negative for metastases or recurrences.  Also, interval resolution of activity in the thyroid bed.  From now on, we can follow her with neck ultrasounds, on an as-needed basis. - She has no masses on palpation of her neck and no neck compression symptoms - we we will check her thyroglobulin thyroglobulin antibodies today-we will use the [facility] assay, for consistency - I will see the patient back in 1 year  2. Patient with history of total thyroidectomy for cancer, now with iatrogenic hypothyroidism, on levothyroxine therapy. - latest thyroid labs reviewed with pt >> normal: Lab Results  Component Value Date   TSH 1.74 12/23/2018   - she continues on LT4 100 mcg daily - pt feels good on this dose.  She does have occasional palpitations, which I doubt are related to her levothyroxine treatment. - we discussed about taking the thyroid hormone every day, with water, >30 minutes before breakfast, separated by >4 hours from acid reflux medications, calcium, iron, multivitamins. Pt. is taking it correctly. - will check thyroid tests today: TSH and fT4 - If labs are abnormal, she will need to return for repeat TFTs in 1.5 months  Office Visit on 06/23/2019  Component Date Value Ref Range Status  . TSH 06/23/2019 0.74  0.35 - 4.50 uIU/mL Final  . Free T4 06/23/2019 1.05  0.60 - 1.60 ng/dL Final   Comment: Specimens from patients who are undergoing biotin therapy and /or ingesting biotin supplements may contain high levels of biotin.  The higher biotin concentration in these specimens interferes with this Free T4 assay.  Specimens that contain high levels  of biotin may cause false high results for this Free T4 assay.  Please interpret results in light of the total clinical presentation of the  patient.    . Thyroglobulin Antibody 06/23/2019 <1.0  0.0 - 0.9 IU/mL Final   Thyroglobulin Antibody measured by [facility] Methodology  . Thyroglobulin by IMA 06/23/2019 <0.1* 1.5 - 38.5 ng/mL Final   Comment: According to the [employer] of Clinical Biochemistry,  the reference interval for Thyroglobulin (TG) should be related to euthyroid patients and not for patients who underwent thyroidectomy. TG reference intervals for these patients depend on the residual mass of the thyroid tissue left after surgery. Establishing a post-operative baseline is recommended. The assay limit of quantitation is 0.1 ng/mL Thyroglobulin measured by [employer] Immunometric Assay    Excellent tests.  Philemon Kingdom, MD PhD [facility] Endocrinology

## 2019-06-23 NOTE — Patient Instructions (Signed)
Please stop at the lab.  Please continue Levothyroxine 100 Mcg daily.  Take the thyroid hormone every day, with water, at least 30 minutes before breakfast, separated by at least 4 hours from: - acid reflux medications - calcium - iron - multivitamins  Please come back for a follow-up appointment in 1 year.

## 2019-06-24 LAB — THYROGLOBULIN BY IMA: Thyroglobulin by IMA: <0.1 ng/mL — ABNORMAL LOW (ref 1.5–38.5)

## 2019-06-24 LAB — TGAB+THYROGLOBULIN IMA OR LCMS: Thyroglobulin Antibody: <1 IU/mL (ref 0.0–0.9)

## 2019-07-27 ENCOUNTER — Other Ambulatory Visit: Payer: Self-pay | Admitting: Internal Medicine

## 2019-07-27 DIAGNOSIS — Z1231 Encounter for screening mammogram for malignant neoplasm of breast: Secondary | ICD-10-CM

## 2019-08-31 ENCOUNTER — Telehealth: Payer: Self-pay

## 2019-08-31 MED ORDER — LEVOTHYROXINE SODIUM 100 MCG PO TABS: 100.0000 ug | ORAL_TABLET | Freq: Every day | ORAL | 2 refills | Status: DC

## 2019-08-31 NOTE — Telephone Encounter (Signed)
Error

## 2019-09-08 NOTE — Progress Notes (Signed)
59 y.o. G9P2002 Divorced White or Caucasian Not Hispanic or Latino female here for annual exam.  H/O TVH for fibroids. Not sexually active.  She is living with her mom to take care of her. Broke her hip, has DM, suspects mild dementia.   H/O thyroidectomy for thyroid cancer in 2019, then treated with radioactive iodine.     No LMP recorded. Patient has had a hysterectomy.          Sexually active: No.  The current method of family planning is status post hysterectomy.    Exercising: Yes.    walking  Smoker:  no  Health Maintenance: Pap:  2018 at [facility] in [place] History of abnormal Pap:  no MMG:  10/07/2017 Birads 1 negative BMD:   Never  Colonoscopy: Never, tried to have one, got very sick with the prep Cologuard: 10/02/18 negative. TDaP:  07/05/14 Gardasil: NA    reports that she quit smoking about 33 years ago. She has never used smokeless tobacco. She reports current alcohol use. She reports that she does not use drugs. Has 2 drinks a night, goes stretches without any ETOH. Accountant. 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 5 grand children, 3 in [place]. Other's in [place]. 3 sister's, 2 are local.   Past Medical History:  Diagnosis Date  . Chicken pox   . Fibroid   . PONV (postoperative nausea and vomiting)   . Thyroid neoplasm     Past Surgical History:  Procedure Laterality Date  . ABLATION    . AUGMENTATION MAMMAPLASTY    . BREAST ENHANCEMENT SURGERY     2010  . BREAST SURGERY     breast bx 2015 benign breasts cysts   . FOOT SURGERY     left removed partial nerve   . THYROID LOBECTOMY Left 12/02/2017   Procedure: LEFT THYROID LOBECTOMY;  Surgeon: Armandina Gemma, MD;  Location: [facility];  Service: General;  Laterality: Left;  . THYROIDECTOMY N/A 12/10/2017   Procedure: COMPLETION THYROIDECTOMY;  Surgeon: Armandina Gemma, MD;  Location: [facility];  Service: General;  Laterality: N/A;  . TONSILLECTOMY    . TOTAL VAGINAL HYSTERECTOMY     fibroids     Current Outpatient  Medications  Medication Sig Dispense Refill  . levothyroxine (SYNTHROID) 100 MCG tablet Take 1 tablet (100 mcg total) by mouth daily. 90 tablet 2   No current facility-administered medications for this visit.    Family History  Problem Relation Age of Onset  . Diabetes Mother   . Heart disease Mother   . Depression Mother   . Alcohol abuse Father   . Cancer Father        mesthelioma house with asbestos   . Skin cancer Sister   . Skin cancer Brother   . Bladder Cancer Maternal Aunt   . Breast cancer Paternal Aunt     Review of Systems  All other systems reviewed and are negative.   Exam:   BP 118/64   Pulse 77   Temp (!) 97 F (36.1 C)   Ht 5' 8.5" (1.74 m)   Wt 171 lb (77.6 kg)   SpO2 90%   BMI 25.62 kg/m   Weight change: @WEIGHTCHANGE @ Height:   Height: 5' 8.5" (174 cm)  Ht Readings from Last 3 Encounters:  09/10/19 5' 8.5" (1.74 m)  06/23/19 5' 8.5" (1.74 m)  12/23/18 5' 8.5" (1.74 m)    General appearance: alert, cooperative and appears stated age Head: Normocephalic, without obvious abnormality, atraumatic Neck:  no adenopathy, supple, symmetrical, trachea midline and thyroid absent thyroid, no masses Lungs: clear to auscultation bilaterally Cardiovascular: regular rate and rhythm Breasts: normal appearance, no masses or tenderness Abdomen: soft, non-tender; non distended,  no masses,  no organomegaly Extremities: extremities normal, atraumatic, no cyanosis or edema Skin: Skin color, texture, turgor normal. No rashes or lesions Lymph nodes: Cervical, supraclavicular, and axillary nodes normal. No abnormal inguinal nodes palpated Neurologic: Grossly normal   Pelvic: External genitalia:  no lesions              Urethra:  normal appearing urethra with no masses, tenderness or lesions              Bartholins and Skenes: normal                 Vagina: atrophic appearing vagina with normal color and discharge, no lesions              Cervix: absent                Bimanual Exam:  Uterus:  uterus absent              Adnexa: no mass, fullness, tenderness               Rectovaginal: Confirms               Anus:  normal sphincter tone, no lesions  Terence Lux chaperoned for the exam.  A:  Well Woman with normal exam  Mom with h/o hip fracture  P:   No pap needed  Mammogram overdue she will schedule  Cologuard normal last year  DEXA ordered  Discussed breast self exam  Discussed calcium and vit D intake

## 2019-09-09 ENCOUNTER — Other Ambulatory Visit: Payer: Self-pay

## 2019-09-10 ENCOUNTER — Encounter: Payer: Self-pay | Admitting: Obstetrics and Gynecology

## 2019-09-10 ENCOUNTER — Ambulatory Visit (INDEPENDENT_AMBULATORY_CARE_PROVIDER_SITE_OTHER): Payer: BC Managed Care – PPO | Admitting: Obstetrics and Gynecology

## 2019-09-10 VITALS — BP 118/64 | HR 77 | Temp 97.0°F | Ht 68.5 in | Wt 171.0 lb

## 2019-09-10 DIAGNOSIS — Z01419 Encounter for gynecological examination (general) (routine) without abnormal findings: Secondary | ICD-10-CM | POA: Diagnosis not present

## 2019-09-10 DIAGNOSIS — E2839 Other primary ovarian failure: Secondary | ICD-10-CM

## 2019-09-10 DIAGNOSIS — Z0000 Encounter for general adult medical examination without abnormal findings: Secondary | ICD-10-CM | POA: Diagnosis not present

## 2019-09-10 DIAGNOSIS — Z8489 Family history of other specified conditions: Secondary | ICD-10-CM | POA: Diagnosis not present

## 2019-09-10 NOTE — Patient Instructions (Signed)

## 2019-09-11 LAB — COMPREHENSIVE METABOLIC PANEL
ALT: 16 IU/L (ref 0–32)
AST: 22 IU/L (ref 0–40)
Albumin/Globulin Ratio: 1.6 (ref 1.2–2.2)
Albumin: 4.1 g/dL (ref 3.8–4.9)
Alkaline Phosphatase: 87 IU/L (ref 48–121)
BUN/Creatinine Ratio: 20 (ref 12–28)
BUN: 16 mg/dL (ref 8–27)
Bilirubin Total: 0.4 mg/dL (ref 0.0–1.2)
CO2: 25 mmol/L (ref 20–29)
Calcium: 9.2 mg/dL (ref 8.7–10.3)
Chloride: 103 mmol/L (ref 96–106)
Creatinine, Ser: 0.81 mg/dL (ref 0.57–1.00)
GFR calc Af Amer: 91 mL/min/{1.73_m2} (ref >59)
GFR calc non Af Amer: 79 mL/min/{1.73_m2} (ref >59)
Globulin, Total: 2.6 g/dL (ref 1.5–4.5)
Glucose: 74 mg/dL (ref 65–99)
Potassium: 4.1 mmol/L (ref 3.5–5.2)
Sodium: 144 mmol/L (ref 134–144)
Total Protein: 6.7 g/dL (ref 6.0–8.5)

## 2019-09-11 LAB — LIPID PANEL
Chol/HDL Ratio: 2.8 ratio (ref 0.0–4.4)
Cholesterol, Total: 195 mg/dL (ref 100–199)
HDL: 70 mg/dL (ref >39)
LDL Chol Calc (NIH): 106 mg/dL — ABNORMAL HIGH (ref 0–99)
Triglycerides: 106 mg/dL (ref 0–149)
VLDL Cholesterol Cal: 19 mg/dL (ref 5–40)

## 2019-09-11 LAB — CBC
Hematocrit: 42.5 % (ref 34.0–46.6)
Hemoglobin: 14.4 g/dL (ref 11.1–15.9)
MCH: 32.4 pg (ref 26.6–33.0)
MCHC: 33.9 g/dL (ref 31.5–35.7)
MCV: 96 fL (ref 79–97)
Platelets: 279 10*3/uL (ref 150–450)
RBC: 4.45 x10E6/uL (ref 3.77–5.28)
RDW: 12 % (ref 11.7–15.4)
WBC: 5.1 10*3/uL (ref 3.4–10.8)

## 2019-09-24 ENCOUNTER — Ambulatory Visit
Admission: RE | Admit: 2019-09-24 | Discharge: 2019-09-24 | Disposition: A | Discharge status: Home / Self Care | Payer: BC Managed Care – PPO | Source: Ambulatory Visit | Attending: Internal Medicine | Admitting: Internal Medicine

## 2019-09-24 ENCOUNTER — Other Ambulatory Visit: Payer: Self-pay

## 2019-09-24 DIAGNOSIS — Z1231 Encounter for screening mammogram for malignant neoplasm of breast: Secondary | ICD-10-CM

## 2019-10-01 DIAGNOSIS — Z713 Dietary counseling and surveillance: Secondary | ICD-10-CM | POA: Diagnosis not present

## 2019-11-02 ENCOUNTER — Other Ambulatory Visit: Payer: Self-pay

## 2019-11-02 ENCOUNTER — Ambulatory Visit
Admission: RE | Admit: 2019-11-02 | Discharge: 2019-11-02 | Disposition: A | Discharge status: Home / Self Care | Payer: BC Managed Care – PPO | Source: Ambulatory Visit | Attending: Obstetrics and Gynecology | Admitting: Obstetrics and Gynecology

## 2019-11-02 DIAGNOSIS — Z8489 Family history of other specified conditions: Secondary | ICD-10-CM

## 2019-11-02 DIAGNOSIS — E2839 Other primary ovarian failure: Secondary | ICD-10-CM

## 2019-11-02 DIAGNOSIS — M85851 Other specified disorders of bone density and structure, right thigh: Secondary | ICD-10-CM | POA: Diagnosis not present

## 2019-11-02 DIAGNOSIS — Z780 Asymptomatic menopausal state: Secondary | ICD-10-CM | POA: Diagnosis not present

## 2019-11-09 DIAGNOSIS — M170 Bilateral primary osteoarthritis of knee: Secondary | ICD-10-CM | POA: Diagnosis not present

## 2019-11-19 DIAGNOSIS — C73 Malignant neoplasm of thyroid gland: Secondary | ICD-10-CM | POA: Diagnosis not present

## 2019-11-19 DIAGNOSIS — E890 Postprocedural hypothyroidism: Secondary | ICD-10-CM | POA: Diagnosis not present

## 2020-01-21 DIAGNOSIS — Z713 Dietary counseling and surveillance: Secondary | ICD-10-CM | POA: Diagnosis not present

## 2020-02-04 DIAGNOSIS — Z713 Dietary counseling and surveillance: Secondary | ICD-10-CM | POA: Diagnosis not present

## 2020-02-18 DIAGNOSIS — Z713 Dietary counseling and surveillance: Secondary | ICD-10-CM | POA: Diagnosis not present

## 2020-03-03 DIAGNOSIS — Z713 Dietary counseling and surveillance: Secondary | ICD-10-CM | POA: Diagnosis not present

## 2020-03-24 DIAGNOSIS — Z713 Dietary counseling and surveillance: Secondary | ICD-10-CM | POA: Diagnosis not present

## 2020-03-31 DIAGNOSIS — Z713 Dietary counseling and surveillance: Secondary | ICD-10-CM | POA: Diagnosis not present

## 2020-04-07 ENCOUNTER — Telehealth: Payer: Self-pay | Admitting: Internal Medicine

## 2020-04-07 NOTE — Telephone Encounter (Signed)
Fine

## 2020-04-07 NOTE — Telephone Encounter (Signed)
This is fine with me as well

## 2020-04-07 NOTE — Telephone Encounter (Signed)
° ° °  Patient requesting [facility] appointment  Patient states she no longer wants to go to [facility] office  Please advise

## 2020-04-08 NOTE — Telephone Encounter (Signed)
   Called patient, left msg vcml to call for appointment with Dr Sharlet Salina

## 2020-05-29 ENCOUNTER — Other Ambulatory Visit: Payer: Self-pay | Admitting: Internal Medicine

## 2020-05-31 ENCOUNTER — Ambulatory Visit (INDEPENDENT_AMBULATORY_CARE_PROVIDER_SITE_OTHER): Payer: BC Managed Care – PPO | Admitting: Internal Medicine

## 2020-05-31 ENCOUNTER — Other Ambulatory Visit: Payer: Self-pay

## 2020-05-31 ENCOUNTER — Encounter: Payer: Self-pay | Admitting: Internal Medicine

## 2020-05-31 VITALS — BP 118/72 | HR 62 | Temp 98.1°F | Resp 18 | Ht 68.5 in | Wt 171.4 lb

## 2020-05-31 DIAGNOSIS — Z0000 Encounter for general adult medical examination without abnormal findings: Secondary | ICD-10-CM

## 2020-05-31 DIAGNOSIS — E890 Postprocedural hypothyroidism: Secondary | ICD-10-CM | POA: Diagnosis not present

## 2020-05-31 LAB — CBC
HCT: 41.9 % (ref 36.0–46.0)
Hemoglobin: 14.4 g/dL (ref 12.0–15.0)
MCHC: 34.3 g/dL (ref 30.0–36.0)
MCV: 94.1 fl (ref 78.0–100.0)
Platelets: 270 10*3/uL (ref 150.0–400.0)
RBC: 4.45 Mil/uL (ref 3.87–5.11)
RDW: 12.6 % (ref 11.5–15.5)
WBC: 4.9 10*3/uL (ref 4.0–10.5)

## 2020-05-31 LAB — COMPREHENSIVE METABOLIC PANEL
ALT: 11 U/L (ref 0–35)
AST: 18 U/L (ref 0–37)
Albumin: 4 g/dL (ref 3.5–5.2)
Alkaline Phosphatase: 68 U/L (ref 39–117)
BUN: 18 mg/dL (ref 6–23)
CO2: 30 mEq/L (ref 19–32)
Calcium: 9.3 mg/dL (ref 8.4–10.5)
Chloride: 106 mEq/L (ref 96–112)
Creatinine, Ser: 0.79 mg/dL (ref 0.40–1.20)
GFR: 80.68 mL/min (ref >60.00)
Glucose, Bld: 90 mg/dL (ref 70–99)
Potassium: 4.2 mEq/L (ref 3.5–5.1)
Sodium: 141 mEq/L (ref 135–145)
Total Bilirubin: 0.9 mg/dL (ref 0.2–1.2)
Total Protein: 6.8 g/dL (ref 6.0–8.3)

## 2020-05-31 LAB — LIPID PANEL
Cholesterol: 174 mg/dL (ref 0–200)
HDL: 65.2 mg/dL (ref >39.00)
LDL Cholesterol: 86 mg/dL (ref 0–99)
NonHDL: 108.7
Total CHOL/HDL Ratio: 3
Triglycerides: 114 mg/dL (ref 0.0–149.0)
VLDL: 22.8 mg/dL (ref 0.0–40.0)

## 2020-05-31 NOTE — Progress Notes (Unsigned)
   Subjective:   Patient ID: Madison [name], female    DOB: 04/27/1960, 60 y.o.   MRN: [id number]  HPI The patient is a 60 YO female coming in for transfer of care/physical.   PMH, [employer], social history reviewed and updated  Review of Systems  Constitutional: Negative.   HENT: Negative.   Eyes: Negative.   Respiratory: Negative for cough, chest tightness and shortness of breath.   Cardiovascular: Negative for chest pain, palpitations and leg swelling.  Gastrointestinal: Negative for abdominal distention, abdominal pain, constipation, diarrhea, nausea and vomiting.  Musculoskeletal: Negative.   Skin: Negative.   Neurological: Negative.   Psychiatric/Behavioral: Negative.     Objective:  Physical Exam Constitutional:      Appearance: She is well-developed.  HENT:     Head: Normocephalic and atraumatic.  Cardiovascular:     Rate and Rhythm: Normal rate and regular rhythm.  Pulmonary:     Effort: Pulmonary effort is normal. No respiratory distress.     Breath sounds: Normal breath sounds. No wheezing or rales.  Abdominal:     General: Bowel sounds are normal. There is no distension.     Palpations: Abdomen is soft.     Tenderness: There is no abdominal tenderness. There is no rebound.  Musculoskeletal:     Cervical back: Normal range of motion.  Skin:    General: Skin is warm and dry.  Neurological:     Mental Status: She is alert and oriented to person, place, and time.     Coordination: Coordination normal.     Vitals:   05/31/20 0802  BP: 118/72  Pulse: 62  Resp: 18  Temp: 98.1 F (36.7 C)  TempSrc: Oral  SpO2: 98%  Weight: 171 lb 6.4 oz (77.7 kg)  Height: 5' 8.5" (1.74 m)    This visit occurred during the SARS-CoV-2 public health emergency.  Safety protocols were in place, including screening questions prior to the visit, additional usage of staff PPE, and extensive cleaning of exam room while observing appropriate contact time as indicated for disinfecting  solutions.   Assessment & Plan:

## 2020-05-31 NOTE — Patient Instructions (Signed)
Health Maintenance, Female Adopting a healthy lifestyle and getting preventive care are important in promoting health and wellness. Ask your health care provider about:  The right schedule for you to have regular tests and exams.  Things you can do on your own to prevent diseases and keep yourself healthy. What should I know about diet, weight, and exercise? Eat a healthy diet  Eat a diet that includes plenty of vegetables, fruits, low-fat dairy products, and lean protein.  Do not eat a lot of foods that are high in solid fats, added sugars, or sodium.   Maintain a healthy weight Body mass index (BMI) is used to identify weight problems. It estimates body fat based on height and weight. Your health care provider can help determine your BMI and help you achieve or maintain a healthy weight. Get regular exercise Get regular exercise. This is one of the most important things you can do for your health. Most adults should:  Exercise for at least 150 minutes each week. The exercise should increase your heart rate and make you sweat (moderate-intensity exercise).  Do strengthening exercises at least twice a week. This is in addition to the moderate-intensity exercise.  Spend less time sitting. Even light physical activity can be beneficial. Watch cholesterol and blood lipids Have your blood tested for lipids and cholesterol at 62 years of age, then have this test every 5 years. Have your cholesterol levels checked more often if:  Your lipid or cholesterol levels are high.  You are older than 62 years of age.  You are at high risk for heart disease. What should I know about cancer screening? Depending on your health history and family history, you may need to have cancer screening at various ages. This may include screening for:  Breast cancer.  Cervical cancer.  Colorectal cancer.  Skin cancer.  Lung cancer. What should I know about heart disease, diabetes, and high blood  pressure? Blood pressure and heart disease  High blood pressure causes heart disease and increases the risk of stroke. This is more likely to develop in people who have high blood pressure readings, are of African descent, or are overweight.  Have your blood pressure checked: ? Every 3-5 years if you are 18-39 years of age. ? Every year if you are 40 years old or older. Diabetes Have regular diabetes screenings. This checks your fasting blood sugar level. Have the screening done:  Once every three years after age 40 if you are at a normal weight and have a low risk for diabetes.  More often and at a younger age if you are overweight or have a high risk for diabetes. What should I know about preventing infection? Hepatitis B If you have a higher risk for hepatitis B, you should be screened for this virus. Talk with your health care provider to find out if you are at risk for hepatitis B infection. Hepatitis C Testing is recommended for:  Everyone born from 1945 through 1965.  Anyone with known risk factors for hepatitis C. Sexually transmitted infections (STIs)  Get screened for STIs, including gonorrhea and chlamydia, if: ? You are sexually active and are younger than 62 years of age. ? You are older than 62 years of age and your health care provider tells you that you are at risk for this type of infection. ? Your sexual activity has changed since you were last screened, and you are at increased risk for chlamydia or gonorrhea. Ask your health care provider   if you are at risk.  Ask your health care provider about whether you are at high risk for HIV. Your health care provider may recommend a prescription medicine to help prevent HIV infection. If you choose to take medicine to prevent HIV, you should first get tested for HIV. You should then be tested every 3 months for as long as you are taking the medicine. Pregnancy  If you are about to stop having your period (premenopausal) and  you may become pregnant, seek counseling before you get pregnant.  Take 400 to 800 micrograms (mcg) of folic acid every day if you become pregnant.  Ask for birth control (contraception) if you want to prevent pregnancy. Osteoporosis and menopause Osteoporosis is a disease in which the bones lose minerals and strength with aging. This can result in bone fractures. If you are 65 years old or older, or if you are at risk for osteoporosis and fractures, ask your health care provider if you should:  Be screened for bone loss.  Take a calcium or vitamin D supplement to lower your risk of fractures.  Be given hormone replacement therapy (HRT) to treat symptoms of menopause. Follow these instructions at home: Lifestyle  Do not use any products that contain nicotine or tobacco, such as cigarettes, e-cigarettes, and chewing tobacco. If you need help quitting, ask your health care provider.  Do not use street drugs.  Do not share needles.  Ask your health care provider for help if you need support or information about quitting drugs. Alcohol use  Do not drink alcohol if: ? Your health care provider tells you not to drink. ? You are pregnant, may be pregnant, or are planning to become pregnant.  If you drink alcohol: ? Limit how much you use to 0-1 drink a day. ? Limit intake if you are breastfeeding.  Be aware of how much alcohol is in your drink. In the [place] one drink equals one 12 oz bottle of beer (355 mL), one 5 oz glass of wine (148 mL), or one 1 oz glass of hard liquor (44 mL). General instructions  Schedule regular health, dental, and eye exams.  Stay current with your vaccines.  Tell your health care provider if: ? You often feel depressed. ? You have ever been abused or do not feel safe at home. Summary  Adopting a healthy lifestyle and getting preventive care are important in promoting health and wellness.  Follow your health care provider's instructions about healthy  diet, exercising, and getting tested or screened for diseases.  Follow your health care provider's instructions on monitoring your cholesterol and blood pressure. This information is not intended to replace advice given to you by your health care provider. Make sure you discuss any questions you have with your health care provider. Document Revised: 02/26/2018 Document Reviewed: 02/26/2018 Elsevier Patient Education  2021 Elsevier Inc.  

## 2020-06-03 ENCOUNTER — Encounter: Payer: Self-pay | Admitting: Internal Medicine

## 2020-06-03 NOTE — Assessment & Plan Note (Signed)
Flu shot up to date. Covid-19 up to date counseled eligible for 4th shot June 2022. Shingrix complete. Tetanus up to date. Cologuard up to date. Mammogram up to date, pap smear not indicated. Counseled about sun safety and mole surveillance. Counseled about the dangers of distracted driving. Given 10 year screening recommendations.

## 2020-06-03 NOTE — Assessment & Plan Note (Signed)
Seeing endo and they are managing her synthroid dosing.

## 2020-06-21 DIAGNOSIS — L821 Other seborrheic keratosis: Secondary | ICD-10-CM | POA: Diagnosis not present

## 2020-06-21 DIAGNOSIS — D229 Melanocytic nevi, unspecified: Secondary | ICD-10-CM | POA: Diagnosis not present

## 2020-06-21 DIAGNOSIS — D1801 Hemangioma of skin and subcutaneous tissue: Secondary | ICD-10-CM | POA: Diagnosis not present

## 2020-06-21 DIAGNOSIS — L814 Other melanin hyperpigmentation: Secondary | ICD-10-CM | POA: Diagnosis not present

## 2020-06-22 ENCOUNTER — Encounter: Payer: Self-pay | Admitting: Internal Medicine

## 2020-06-22 ENCOUNTER — Other Ambulatory Visit: Payer: Self-pay

## 2020-06-22 ENCOUNTER — Ambulatory Visit (INDEPENDENT_AMBULATORY_CARE_PROVIDER_SITE_OTHER): Payer: BC Managed Care – PPO | Admitting: Internal Medicine

## 2020-06-22 VITALS — BP 120/78 | HR 67 | Ht 68.5 in | Wt 171.2 lb

## 2020-06-22 DIAGNOSIS — E890 Postprocedural hypothyroidism: Secondary | ICD-10-CM | POA: Diagnosis not present

## 2020-06-22 DIAGNOSIS — C73 Malignant neoplasm of thyroid gland: Secondary | ICD-10-CM

## 2020-06-22 LAB — T4, FREE: Free T4: 1.13 ng/dL (ref 0.60–1.60)

## 2020-06-22 LAB — TSH: TSH: 1.23 u[IU]/mL (ref 0.35–4.50)

## 2020-06-22 NOTE — Patient Instructions (Addendum)
Please stop at the lab.  Please continue Levothyroxine 100 mcg daily.  Take the thyroid hormone every day, with water, at least 30 minutes before breakfast, separated by at least 4 hours from: - acid reflux medications - calcium - iron - multivitamins  Please come back for a follow-up appointment in 1 year.

## 2020-06-22 NOTE — Progress Notes (Signed)
Patient ID: Madison [name], female   DOB: February 08, 1961, 60 y.o.   MRN: [id number]  This visit occurred during the SARS-CoV-2 public health emergency.  Safety protocols were in place, including screening questions prior to the visit, additional usage of staff PPE, and extensive cleaning of exam room while observing appropriate contact time as indicated for disinfecting solutions.   HPI  Madison [name] is a 60 y.o.-year-old female, initially referred by Dr. 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  She is was seeing Dr. Gabriel Carina before.  Last visit with me 1 year ago.  Interval history: She has had no medical issues since last visit. She reports joint pains (knees) for which she started collagen in her tea. Weight has been stable.  No neck compression symptoms.  Reviewed history: She felt a nodule in her neck 03/2017.  She presented to see her PCP who ordered a thyroid ultrasound.  A large thyroid nodule was seen in the left neck.  This was biopsied and the lesion was inconclusive.  She was then sent for left hemithyroidectomy which showed Hurthle cell cancer.  Reviewed patient's chart and Dr. Joycie Peek notes: 09/10/2017: FNA of the left thyroid nodule: Cystic components (scant cellular material), inconclusive 12/02/2017: Left hemithyroidectomy-Dr. Harlow Asa. Pathology: 3.5 x 2.6 x 2.2 cm Hurthle cell carcinoma with negative margins and no lymphatic invasion but with angioinvasion.  No lymph nodes were submitted then.  Staging: pT2Nx 12/11/2017: Total thyroidectomy-Dr. Harlow Asa Pathology: Right lobe benign 01/06/2018: RAI treatment with 75 mCi Post ablative WBS: 2 foci of uptake in the thyroid bed, no distant uptake 05/29/2018: Neck ultrasound: Normal changes after thyroidectomy without significant anterior neck adenopathy 02/16/2019: Whole-body scan: interval resolution of activity in the thyroid bed, no metastases  Her thyroglobulin levels and antibodies  have been undetectable: 03-15-2018: Tg <0.1, ATA <1 05/28/2018: Tg <0.1, ATA <1  12/23/2018: Tg <2, ATA <1   Pt denies: - feeling nodules in neck - hoarseness - dysphagia - choking - SOB with lying down  Postsurgical hypothyroidism  Pt is on levothyroxine 100 mcg daily, taken: - in am (5:30 am) - fasting - at least 30 min from b'fast - no Ca, Fe,  PPIs, + multivitamins at night or lunchtime - + on Biotin - off x1 week + collagen powder in tea later in am  Reviewed her TFTs: Lab Results  Component Value Date   TSH 0.74 06/23/2019   TSH 1.74 12/23/2018   TSH 2.03 07/18/2017   FREET4 1.05 06/23/2019   FREET4 1.07 12/23/2018   FREET4 0.83 07/18/2017  09/15/2018: TSH 1.738 05/28/2018: TSH 0.907 03/15/2018: TSH 0.352    At last visit, she described some weight gain, irrascibility and occasional palpitations.  At last visit she still had occasional palpitations, which resolved since.  No family history of thyroid disorders or thyroid cancer. No history of radiation therapy to head or neck.  She had radiotherapy as a child after an infected vaccine wound.   ROS: Constitutional: no weight gain/no weight loss, no fatigue, no subjective hyperthermia, no subjective hypothermia Eyes: no blurry vision, no xerophthalmia ENT: no sore throat, + see HPI Cardiovascular: no CP/no SOB/no palpitations/no leg swelling Respiratory: no cough/no SOB/no wheezing Gastrointestinal: no N/no V/no D/no C/no acid reflux Musculoskeletal: no muscle aches/no joint aches Skin: no rashes, no hair loss Neurological: no tremors/no numbness/no tingling/no dizziness  I reviewed pt's medications, allergies, PMH, social hx, family hx, and changes were documented in the history of present illness. Otherwise,  unchanged from my initial visit note.  Past Medical History:  Diagnosis Date  . Chicken pox   . Fibroid   . PONV (postoperative nausea and vomiting)   . Thyroid neoplasm    Past Surgical History:   Procedure Laterality Date  . ABLATION    . AUGMENTATION MAMMAPLASTY    . BREAST ENHANCEMENT SURGERY     2010  . BREAST SURGERY     breast bx 2015 benign breasts cysts   . FOOT SURGERY     left removed partial nerve   . THYROID LOBECTOMY Left 12/02/2017   Procedure: LEFT THYROID LOBECTOMY;  Surgeon: Armandina Gemma, MD;  Location: [facility];  Service: General;  Laterality: Left;  . THYROIDECTOMY N/A 12/10/2017   Procedure: COMPLETION THYROIDECTOMY;  Surgeon: Armandina Gemma, MD;  Location: [facility];  Service: General;  Laterality: N/A;  . TONSILLECTOMY    . TOTAL VAGINAL HYSTERECTOMY     fibroids    Social History   Socioeconomic History  . Marital status: Divorced    Spouse name: Not on file  . Number of children: Not on file  . Years of education: Not on file  . Highest education level: Not on file  Occupational History  . Not on file  Tobacco Use  . Smoking status: Former Smoker    Quit date: 1988    Years since quitting: 34.2  . Smokeless tobacco: Never Used  . Tobacco comment: smoked 13 years ago 1/2 ppd   Vaping Use  . Vaping Use: Never used  Substance and Sexual Activity  . Alcohol use: Yes    Comment: occasionally   . Drug use: Never  . Sexual activity: Not Currently    Birth control/protection: Surgical    Comment: Hysterectomy 2008   Other Topics Concern  . Not on file  Social History Narrative   Safe in relationship    Owns guns    Wears Biomedical engineer    2 kids    Social Determinants of Health   Financial Resource Strain: Not on file  Food Insecurity: Not on file  Transportation Needs: Not on file  Physical Activity: Not on file  Stress: Not on file  Social Connections: Not on file  Intimate Partner Violence: Not on file   Current Outpatient Medications on File Prior to Visit  Medication Sig Dispense Refill  . levothyroxine (SYNTHROID) 100 MCG tablet TAKE 1 TABLET BY MOUTH EVERY DAY 30 tablet 0  . Multiple Vitamin (MULTIVITAMIN)  tablet Take 1 tablet by mouth daily.     No current facility-administered medications on file prior to visit.   Allergies  Allergen Reactions  . Adhesive [Tape]     Had moles removes on back and when they put bandage over it , it caused bumpiness underneath the bandage    Family History  Problem Relation Age of Onset  . Diabetes Mother   . Heart disease Mother   . Depression Mother   . Hip fracture Mother   . Alcohol abuse Father   . Cancer Father        mesthelioma house with asbestos   . Skin cancer Sister   . Skin cancer Brother   . Bladder Cancer Maternal Aunt   . Breast cancer Paternal Aunt    PE: BP 120/78 (BP Location: Right Arm, Patient Position: Sitting, Cuff Size: Normal)   Pulse 67   Ht 5' 8.5" (1.74 m)   Wt 171 lb 3.2  oz (77.7 kg)   SpO2 97%   BMI 25.65 kg/m  Wt Readings from Last 3 Encounters:  06/22/20 171 lb 3.2 oz (77.7 kg)  05/31/20 171 lb 6.4 oz (77.7 kg)  09/10/19 171 lb (77.6 kg)   Constitutional: Normal weight, in NAD Eyes: PERRLA, EOMI, no exophthalmos ENT: moist mucous membranes, no neck masses palpated, + thyroidectomy scar healed, no cervical lymphadenopathy Cardiovascular: RRR, No MRG Respiratory: CTA B Gastrointestinal: abdomen soft, NT, ND, BS+ Musculoskeletal: no deformities, strength intact in all 4 Skin: moist, warm, no rashes Neurological: no tremor with outstretched hands, DTR normal in all 4  ASSESSMENT: 1. Thyroid cancer (Hurthle cell) - see HPI  2. Postsurgical Hypothyroidism  PLAN:  1.  Hurthle cell thyroid cancer - Patient with history of Hurthle cell thyroid cancer without lymphatic invasion but with angioinvasion.  Because of this, she has a slightly higher risk of metastasis.  Also, both of thyroid cancer is a more unusual type of cancer, which may be less responsive to RAI treatment and this will be affected by thyroglobulin levels.  However, in general, thyroid cancer is a slow-growing cancer, with good prognosis.  At  the end of 2020, due to the slightly higher risk compared to the plastic papillary thyroid cancer, we checked a whole-body scan and this was negative for metastases or recurrences.  Also, there was no increased activity in the thyroid bed.  Therefore, from now on, we can follow her with neck ultrasounds on an as-needed basis. - At this visit, she has no masses on palpation of her neck and denies neck compression symptoms - we we will check her thyroglobulin and thyroglobulin antibodies with the [facility] assay, for consistency - I will see the patient back in 1 year  2. Patient with history of total thyroidectomy for cancer, now with iatrogenic hypothyroidism, on levothyroxine therapy. - latest thyroid labs reviewed with pt >> normal: Lab Results  Component Value Date   TSH 0.74 06/23/2019   - she continues on LT4 100 mcg daily - pt feels good on this dose. - we discussed about taking the thyroid hormone every day, with water, >30 minutes before breakfast, separated by >4 hours from acid reflux medications, calcium, iron, multivitamins. Pt. is taking it correctly. - will check thyroid tests today: TSH and fT4 - If labs are abnormal, she will need to return for repeat TFTs in 1.5 months  Needs 90 days refills.  Component     Latest Ref Rng & Units 06/22/2020  TSH     0.35 - 4.50 uIU/mL 1.23  T4,Free(Direct)     0.60 - 1.60 ng/dL 1.13  Thyroglobulin Antibody     0.0 - 0.9 IU/mL <1.0  Thyroglobulin by IMA     1.5 - 38.5 ng/mL <0.1 (L)  Labs are all at goal.  Philemon Kingdom, MD PhD [facility]

## 2020-06-23 LAB — TGAB+THYROGLOBULIN IMA OR LCMS: Thyroglobulin Antibody: <1 IU/mL (ref 0.0–0.9)

## 2020-06-23 LAB — THYROGLOBULIN BY IMA: Thyroglobulin by IMA: <0.1 ng/mL — ABNORMAL LOW (ref 1.5–38.5)

## 2020-06-23 MED ORDER — LEVOTHYROXINE SODIUM 100 MCG PO TABS: 100.0000 ug | ORAL_TABLET | Freq: Every day | ORAL | 3 refills | Status: DC

## 2020-07-27 DIAGNOSIS — Z20822 Contact with and (suspected) exposure to covid-19: Secondary | ICD-10-CM | POA: Diagnosis not present

## 2020-08-12 ENCOUNTER — Other Ambulatory Visit: Payer: Self-pay | Admitting: Internal Medicine

## 2020-08-12 DIAGNOSIS — Z1231 Encounter for screening mammogram for malignant neoplasm of breast: Secondary | ICD-10-CM

## 2020-09-12 NOTE — Progress Notes (Signed)
60 y.o. G78P2002 Divorced White or Caucasian Not Hispanic or Latino female here for annual exam.  H/O TVH. She moved in with her mother to help her. H/O hip fracture 2 years ago, recently broke her leg, has diabetes, recently had DKA.   H/O thyroidectomy for thyroid cancer in 2019, then treated with radioactive iodine.     No LMP recorded. Patient has had a hysterectomy.          Sexually active: No.  The current method of family planning is diaphragm and status post hysterectomy.    Exercising: Yes.     Home workouts  Smoker:  no  Health Maintenance: Pap:  2018 at [facility] in [place]  History of abnormal Pap:  no MMG:  09/28/19 density B Bi rads 1 neg  BMD:   11/02/19 osteopenic,  T score -1.3, Frax 15.5/0.6%. On synthroid, f/u DEXA in 2 years. Colonoscopy: Never, tried to have one, got very sick with the prep Cologuard: negative 10/02/18 TDaP:  07/05/14  Gardasil: NA    reports that she quit smoking about 34 years ago. Her smoking use included cigarettes. She has never used smokeless tobacco. She reports current alcohol use. She reports that she does not use drugs.Accountant. 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 5 grand children (39, 89 year old twins, 9 and 7), 3 in [place]. Other's in [place]. 3 sister's, 2 are local.   Past Medical History:  Diagnosis Date   Chicken pox    Fibroid    PONV (postoperative nausea and vomiting)    Thyroid neoplasm     Past Surgical History:  Procedure Laterality Date   ABLATION     AUGMENTATION MAMMAPLASTY     BREAST ENHANCEMENT SURGERY     2010   BREAST SURGERY     breast bx 2015 benign breasts cysts    FOOT SURGERY     left removed partial nerve    THYROID LOBECTOMY Left 12/02/2017   Procedure: LEFT THYROID LOBECTOMY;  Surgeon: Armandina Gemma, MD;  Location: [facility];  Service: General;  Laterality: Left;   THYROIDECTOMY N/A 12/10/2017   Procedure: COMPLETION THYROIDECTOMY;  Surgeon: Armandina Gemma, MD;  Location: [facility];  Service: General;  Laterality:  N/A;   TONSILLECTOMY     TOTAL VAGINAL HYSTERECTOMY     fibroids     Current Outpatient Medications  Medication Sig Dispense Refill   levothyroxine (SYNTHROID) 100 MCG tablet Take 1 tablet (100 mcg total) by mouth daily. 90 tablet 3   Multiple Vitamin (MULTIVITAMIN) tablet Take 1 tablet by mouth daily.     No current facility-administered medications for this visit.    Family History  Problem Relation Age of Onset   Diabetes Mother    Heart disease Mother    Depression Mother    Hip fracture Mother    Alcohol abuse Father    Cancer Father        mesthelioma house with asbestos    Skin cancer Sister    Skin cancer Brother    Bladder Cancer Maternal Aunt    Breast cancer Paternal Aunt     Review of Systems  All other systems reviewed and are negative.  Exam:   BP 126/64   Pulse 72   Ht 5\' 9"  (1.753 m)   Wt 169 lb (76.7 kg)   SpO2 97%   BMI 24.96 kg/m   Weight change: @WEIGHTCHANGE @ Height:   Height: 5\' 9"  (175.3 cm)  Ht Readings from Last 3 Encounters:  09/14/20 5\' 9"  (1.753 m)  06/22/20 5' 8.5" (1.74 m)  05/31/20 5' 8.5" (1.74 m)    General appearance: alert, cooperative and appears stated age Head: Normocephalic, without obvious abnormality, atraumatic Neck: no adenopathy, supple, symmetrical, trachea midline and thyroid normal to inspection and palpation Lungs: clear to auscultation bilaterally Cardiovascular: regular rate and rhythm Breasts: normal appearance, no masses or tenderness, bilateral implants are soft Abdomen: soft, non-tender; non distended,  no masses,  no organomegaly Extremities: extremities normal, atraumatic, no cyanosis or edema Skin: Skin color, texture, turgor normal. No rashes or lesions Lymph nodes: Cervical, supraclavicular, and axillary nodes normal. No abnormal inguinal nodes palpated Neurologic: Grossly normal   Pelvic: External genitalia:  no lesions              Urethra:  normal appearing urethra with no masses, tenderness or  lesions              Bartholins and Skenes: normal                 Vagina: atrophic appearing vagina with normal color and discharge, no lesions              Cervix: absent               Bimanual Exam:  Uterus:  uterus absent              Adnexa: no mass, fullness, tenderness               Rectovaginal: Confirms               Anus:  normal sphincter tone, no lesions  Gae Dry chaperoned for the exam.  1. Well woman exam Discussed breast self exam Discussed calcium and vit D intake Labs with primary Mammogram next month Cologuard next year  2. History of osteopenia DEXA next year

## 2020-09-14 ENCOUNTER — Encounter: Payer: Self-pay | Admitting: Obstetrics and Gynecology

## 2020-09-14 ENCOUNTER — Other Ambulatory Visit: Payer: Self-pay

## 2020-09-14 ENCOUNTER — Ambulatory Visit (INDEPENDENT_AMBULATORY_CARE_PROVIDER_SITE_OTHER): Payer: BC Managed Care – PPO | Admitting: Obstetrics and Gynecology

## 2020-09-14 VITALS — BP 126/64 | HR 72 | Ht 69.0 in | Wt 169.0 lb

## 2020-09-14 DIAGNOSIS — Z8739 Personal history of other diseases of the musculoskeletal system and connective tissue: Secondary | ICD-10-CM

## 2020-09-14 DIAGNOSIS — Z01419 Encounter for gynecological examination (general) (routine) without abnormal findings: Secondary | ICD-10-CM | POA: Diagnosis not present

## 2020-09-14 NOTE — Patient Instructions (Signed)

## 2020-09-27 ENCOUNTER — Ambulatory Visit: Payer: BC Managed Care – PPO

## 2020-10-04 ENCOUNTER — Other Ambulatory Visit: Payer: Self-pay

## 2020-10-04 ENCOUNTER — Ambulatory Visit
Admission: RE | Admit: 2020-10-04 | Discharge: 2020-10-04 | Disposition: A | Discharge status: Home / Self Care | Payer: BC Managed Care – PPO | Source: Ambulatory Visit | Attending: Internal Medicine | Admitting: Internal Medicine

## 2020-10-04 DIAGNOSIS — Z1231 Encounter for screening mammogram for malignant neoplasm of breast: Secondary | ICD-10-CM | POA: Diagnosis not present

## 2020-12-30 ENCOUNTER — Other Ambulatory Visit: Payer: Self-pay

## 2020-12-30 ENCOUNTER — Encounter: Payer: Self-pay | Admitting: Podiatry

## 2020-12-30 ENCOUNTER — Ambulatory Visit (INDEPENDENT_AMBULATORY_CARE_PROVIDER_SITE_OTHER): Payer: BC Managed Care – PPO | Admitting: Podiatry

## 2020-12-30 ENCOUNTER — Ambulatory Visit (INDEPENDENT_AMBULATORY_CARE_PROVIDER_SITE_OTHER): Payer: BC Managed Care – PPO

## 2020-12-30 DIAGNOSIS — D3610 Benign neoplasm of peripheral nerves and autonomic nervous system, unspecified: Secondary | ICD-10-CM | POA: Diagnosis not present

## 2020-12-30 DIAGNOSIS — M7751 Other enthesopathy of right foot: Secondary | ICD-10-CM

## 2020-12-30 MED ORDER — TRIAMCINOLONE ACETONIDE 10 MG/ML IJ SUSP
10.0000 mg | Freq: Once | INTRAMUSCULAR | Status: AC
  Administered 2020-12-30: 10 mg

## 2020-12-30 NOTE — Progress Notes (Signed)
Subjective:   Patient ID: Madison [name], female   DOB: 60 y.o.   MRN: [id number]   HPI Patient states she is developed a lot of pain on her right foot over the last 6 months and it is worse when she stands it is worse when she tries to be active.  On her left foot she had a neuroma removed about 6 years ago but states that this foot does feel different neuro   ROS      Objective:  Physical Exam  Vascular status intact with patient found to have inflammation around the third metatarsal phalangeal joint right with fluid buildup mild discomfort in the interspace but does not currently have shooting pains into the adjacent digits     Assessment:  Probability for inflammatory capsulitis versus a nerve pinch right but cannot rule out either of the 2     Plan:  H&P reviewed condition I went ahead today and anesthetized and then aspirated the third MPJ getting out a small amount of clear fluid and I put a small amount of steroid into the joint dexamethasone Kenalog.  Apply thick plantar padding advised rigid bottom shoes reappoint to recheck again in 2 weeks and cannot discount possibility for neuroma  X-rays indicate no signs of fracture or bony injury associated with this

## 2021-01-12 DIAGNOSIS — Z713 Dietary counseling and surveillance: Secondary | ICD-10-CM | POA: Diagnosis not present

## 2021-01-13 ENCOUNTER — Encounter: Payer: Self-pay | Admitting: Podiatry

## 2021-01-13 ENCOUNTER — Other Ambulatory Visit: Payer: Self-pay

## 2021-01-13 ENCOUNTER — Ambulatory Visit (INDEPENDENT_AMBULATORY_CARE_PROVIDER_SITE_OTHER): Payer: BC Managed Care – PPO | Admitting: Podiatry

## 2021-01-13 DIAGNOSIS — G5761 Lesion of plantar nerve, right lower limb: Secondary | ICD-10-CM

## 2021-01-13 DIAGNOSIS — M7751 Other enthesopathy of right foot: Secondary | ICD-10-CM

## 2021-01-13 DIAGNOSIS — D3610 Benign neoplasm of peripheral nerves and autonomic nervous system, unspecified: Secondary | ICD-10-CM

## 2021-01-13 NOTE — Progress Notes (Signed)
Subjective:   Patient ID: Madison [name], female   DOB: 60 y.o.   MRN: [id number]   HPI Patient presents stating it feels somewhat better but she still gets some shooting pains in her right forefoot and still has some discomfort in the joint   ROS      Objective:  Physical Exam  Neurovascular status was found to be intact with inflammation of the third interspace right and moderately into the third metatarsal phalangeal joint right with continued discomfort of the joint surface     Assessment:  Possibility for neuroma symptoms in the right with the possibility also for continued inflammatory capsulitis third MPJ     Plan:  H&P discussed the differences for these 2 conditions and I will continue to work on the nerve and I did sterile prep and I did inject just third interspace now with a combination of purified alcohol Marcaine small amount of Kenalog.  I then went ahead and I discussed the possibility of a future for shortening osteotomy or neuroma excision depending on her response to continued conservative treatment

## 2021-01-17 DIAGNOSIS — Z713 Dietary counseling and surveillance: Secondary | ICD-10-CM | POA: Diagnosis not present

## 2021-01-27 ENCOUNTER — Other Ambulatory Visit: Payer: Self-pay

## 2021-01-27 ENCOUNTER — Ambulatory Visit (INDEPENDENT_AMBULATORY_CARE_PROVIDER_SITE_OTHER): Payer: BC Managed Care – PPO | Admitting: Podiatry

## 2021-01-27 DIAGNOSIS — D3610 Benign neoplasm of peripheral nerves and autonomic nervous system, unspecified: Secondary | ICD-10-CM

## 2021-01-27 NOTE — Progress Notes (Signed)
Subjective:   Patient ID: Madison [name], female   DOB: 60 y.o.   MRN: [id number]   HPI Patient presents stating that she had complete relief for around 8 hours and then reoccurrence of symptoms and states that she did have successful surgery on the left and that she was very happy to have that period of relief   ROS      Objective:  Physical Exam  Neurovascular status intact which pain which appears to be mostly between the third and fourth toes right with radiating discomfort when I pressed the metatarsals together and did a Mulder's test with no current inflammation of the metatarsal phalangeal joints     Assessment:  Appears to be neuroma symptomatology with still slight possibility for capsulitis like inflammation     Plan:  H&P reviewed at great length discussed neuroma versus capsulitis and due to the condition appears to be neuroma.  We did discuss MRI that she is not interested in getting and she did have successful surgery on the left 1 and she would like to undergo the same procedure on the right 1.  I allowed her to read consent form going over alternative treatments complications and patient wants surgery and after extensive review signed consent form and was given all preoperative instructions and is encouraged to call with questions understanding that recovery takes approximately 4 to 6 months for complete recovery and she will be in surgical shoe probably for 4 weeks.  Patient is encouraged to call with any other questions which may come up and date was given for surgical scheduling with all paperwork given to patient

## 2021-01-31 DIAGNOSIS — Z713 Dietary counseling and surveillance: Secondary | ICD-10-CM | POA: Diagnosis not present

## 2021-02-02 DIAGNOSIS — Z713 Dietary counseling and surveillance: Secondary | ICD-10-CM | POA: Diagnosis not present

## 2021-02-14 DIAGNOSIS — Z713 Dietary counseling and surveillance: Secondary | ICD-10-CM | POA: Diagnosis not present

## 2021-03-02 DIAGNOSIS — Z713 Dietary counseling and surveillance: Secondary | ICD-10-CM | POA: Diagnosis not present

## 2021-03-07 ENCOUNTER — Telehealth: Payer: Self-pay | Admitting: Urology

## 2021-03-07 NOTE — Telephone Encounter (Signed)
DOS - 03/28/21  NEURECTOMY 3RD RIGHT --- 28080  [employer] EFFECTIVE DATE - 03/19/20   NO PRIOR [place]

## 2021-03-21 DIAGNOSIS — Z713 Dietary counseling and surveillance: Secondary | ICD-10-CM | POA: Diagnosis not present

## 2021-03-27 MED ORDER — HYDROCODONE-ACETAMINOPHEN 10-325 MG PO TABS: 1.0000 | ORAL_TABLET | Freq: Three times a day (TID) | ORAL | 0 refills | Status: AC | PRN

## 2021-03-27 NOTE — Addendum Note (Signed)
Addended by: Wallene Huh on: 03/27/2021 12:05 PM   Modules accepted: Orders

## 2021-03-28 ENCOUNTER — Encounter: Payer: Self-pay | Admitting: Podiatry

## 2021-03-28 DIAGNOSIS — G5761 Lesion of plantar nerve, right lower limb: Secondary | ICD-10-CM | POA: Diagnosis not present

## 2021-04-03 ENCOUNTER — Encounter: Payer: Self-pay | Admitting: Podiatry

## 2021-04-03 ENCOUNTER — Ambulatory Visit (INDEPENDENT_AMBULATORY_CARE_PROVIDER_SITE_OTHER): Payer: BC Managed Care – PPO | Admitting: Podiatry

## 2021-04-03 ENCOUNTER — Other Ambulatory Visit: Payer: Self-pay

## 2021-04-03 DIAGNOSIS — Z9889 Other specified postprocedural states: Secondary | ICD-10-CM

## 2021-04-03 DIAGNOSIS — D3610 Benign neoplasm of peripheral nerves and autonomic nervous system, unspecified: Secondary | ICD-10-CM

## 2021-04-03 NOTE — Progress Notes (Signed)
°  Subjective:  Patient ID: Madison [name], female    DOB: 08-09-1960,  MRN: [id number]  Chief Complaint  Patient presents with   Routine Post Op    Pt is here for POV 1. Pt reports pain reports as stabbing when applying pressure on foot.Taking advil for pain. Wearing surgical shoe. No f/c/v. Reports nausea     DOS: 03/28/21 Procedure: Right soft tissue mass removal of third interspace   61 y.o. female returns for POV#1. Patient doing well. Has some discomfort under her fourth toe but overall minimal pain. Taking advil for pain. Wearing surgical shoe. Denies n/c/v/f.   Review of Systems: Negative except as noted in the HPI. Denies N/V/F/Ch.  Past Medical History:  Diagnosis Date   Chicken pox    Fibroid    PONV (postoperative nausea and vomiting)    Thyroid neoplasm     Current Outpatient Medications:    levothyroxine (SYNTHROID) 100 MCG tablet, Take 1 tablet (100 mcg total) by mouth daily., Disp: 90 tablet, Rfl: 3   Multiple Vitamin (MULTIVITAMIN) tablet, Take 1 tablet by mouth daily., Disp: , Rfl:   Social History   Tobacco Use  Smoking Status Former   Types: Cigarettes   Quit date: 1988   Years since quitting: 35.0  Smokeless Tobacco Never  Tobacco Comments   smoked 13 years ago 1/2 ppd     Allergies  Allergen Reactions   Adhesive [Tape]     Had moles removes on back and when they put bandage over it , it caused bumpiness underneath the bandage    Objective:  There were no vitals filed for this visit. There is no height or weight on file to calculate BMI. Constitutional Well developed. Well nourished.  Vascular Foot warm and well perfused. Capillary refill normal to all digits.   Neurologic Normal speech. Oriented to person, place, and time. Epicritic sensation to light touch grossly present bilaterally.  Dermatologic Skin healing well without signs of infection. Skin edges well coapted without signs of infection.  Orthopedic: Tenderness to palpation noted about  the surgical site.   Assessment:   1. Status post right foot surgery   2. Neuroma    Plan:  Patient was evaluated and treated and all questions answered.  S/p foot surgery right -Progressing as expected post-operatively. -WB Status: WBAT  in surgical shoe  -Sutures: Intact. -Medications: None needed -Foot redressed. Follow in 2 weeks with Dr. Paulla Dolly   No follow-ups on file.

## 2021-04-04 DIAGNOSIS — Z713 Dietary counseling and surveillance: Secondary | ICD-10-CM | POA: Diagnosis not present

## 2021-04-26 ENCOUNTER — Other Ambulatory Visit: Payer: Self-pay

## 2021-04-26 ENCOUNTER — Encounter: Payer: Self-pay | Admitting: Podiatry

## 2021-04-26 ENCOUNTER — Ambulatory Visit (INDEPENDENT_AMBULATORY_CARE_PROVIDER_SITE_OTHER): Payer: BC Managed Care – PPO | Admitting: Podiatry

## 2021-04-26 DIAGNOSIS — M7751 Other enthesopathy of right foot: Secondary | ICD-10-CM

## 2021-04-26 MED ORDER — TRIAMCINOLONE ACETONIDE 10 MG/ML IJ SUSP
10.0000 mg | Freq: Once | INTRAMUSCULAR | Status: AC
  Administered 2021-04-26: 10 mg

## 2021-04-26 NOTE — Progress Notes (Signed)
Subjective:   Patient ID: Madison [name], female   DOB: 61 y.o.   MRN: [id number]   HPI Patient presents stating she is doing well where the neuroma was excised but she feels like she is getting pain around the joint next to it and may be into the interspace   ROS      Objective:  Physical Exam  Neurovascular status intact negative Bevelyn Buckles' sign noted wound edges well coapted third interspace right with discomfort into the third MPJ and slightly into the second interspace     Assessment:  Doing well post neurectomy right with possibility for increased blood flow creating inflammatory capsulitis third MPJ or possibility for mild neuroma second but does not show clinically     Plan:  H&P reviewed condition and today went ahead and did a careful periarticular injection around the third MPJ 2 mg dexamethasone Kenalog 5 mg Xylocaine and I want her to use rigid shoes will be seen back in 1 month to reevaluate

## 2021-05-24 ENCOUNTER — Other Ambulatory Visit: Payer: Self-pay

## 2021-05-24 ENCOUNTER — Ambulatory Visit (INDEPENDENT_AMBULATORY_CARE_PROVIDER_SITE_OTHER): Payer: BC Managed Care – PPO | Admitting: Podiatry

## 2021-05-24 ENCOUNTER — Encounter: Payer: Self-pay | Admitting: Podiatry

## 2021-05-24 DIAGNOSIS — G5761 Lesion of plantar nerve, right lower limb: Secondary | ICD-10-CM

## 2021-05-24 DIAGNOSIS — M7751 Other enthesopathy of right foot: Secondary | ICD-10-CM

## 2021-05-24 DIAGNOSIS — D3610 Benign neoplasm of peripheral nerves and autonomic nervous system, unspecified: Secondary | ICD-10-CM

## 2021-06-01 ENCOUNTER — Other Ambulatory Visit: Payer: Self-pay | Admitting: Internal Medicine

## 2021-06-01 DIAGNOSIS — H9042 Sensorineural hearing loss, unilateral, left ear, with unrestricted hearing on the contralateral side: Secondary | ICD-10-CM | POA: Diagnosis not present

## 2021-06-01 DIAGNOSIS — R42 Dizziness and giddiness: Secondary | ICD-10-CM | POA: Diagnosis not present

## 2021-06-01 DIAGNOSIS — H6063 Unspecified chronic otitis externa, bilateral: Secondary | ICD-10-CM | POA: Diagnosis not present

## 2021-06-01 DIAGNOSIS — H90A22 Sensorineural hearing loss, unilateral, left ear, with restricted hearing on the contralateral side: Secondary | ICD-10-CM | POA: Diagnosis not present

## 2021-06-02 ENCOUNTER — Encounter: Payer: Self-pay | Admitting: Internal Medicine

## 2021-06-02 ENCOUNTER — Ambulatory Visit (INDEPENDENT_AMBULATORY_CARE_PROVIDER_SITE_OTHER): Payer: BC Managed Care – PPO | Admitting: Internal Medicine

## 2021-06-02 ENCOUNTER — Other Ambulatory Visit: Payer: Self-pay

## 2021-06-02 VITALS — BP 122/78 | HR 88 | Resp 18 | Ht 69.0 in | Wt 170.0 lb

## 2021-06-02 DIAGNOSIS — C73 Malignant neoplasm of thyroid gland: Secondary | ICD-10-CM | POA: Diagnosis not present

## 2021-06-02 DIAGNOSIS — Z0000 Encounter for general adult medical examination without abnormal findings: Secondary | ICD-10-CM | POA: Diagnosis not present

## 2021-06-02 DIAGNOSIS — E559 Vitamin D deficiency, unspecified: Secondary | ICD-10-CM | POA: Diagnosis not present

## 2021-06-02 DIAGNOSIS — E890 Postprocedural hypothyroidism: Secondary | ICD-10-CM | POA: Diagnosis not present

## 2021-06-02 LAB — CBC
HCT: 41.4 % (ref 36.0–46.0)
Hemoglobin: 14.2 g/dL (ref 12.0–15.0)
MCHC: 34.3 g/dL (ref 30.0–36.0)
MCV: 94.9 fl (ref 78.0–100.0)
Platelets: 294 10*3/uL (ref 150.0–400.0)
RBC: 4.36 Mil/uL (ref 3.87–5.11)
RDW: 13.4 % (ref 11.5–15.5)
WBC: 5.1 10*3/uL (ref 4.0–10.5)

## 2021-06-02 LAB — LIPID PANEL
Cholesterol: 196 mg/dL (ref 0–200)
HDL: 75.7 mg/dL (ref >39.00)
LDL Cholesterol: 107 mg/dL — ABNORMAL HIGH (ref 0–99)
NonHDL: 120.57
Total CHOL/HDL Ratio: 3
Triglycerides: 69 mg/dL (ref 0.0–149.0)
VLDL: 13.8 mg/dL (ref 0.0–40.0)

## 2021-06-02 LAB — COMPREHENSIVE METABOLIC PANEL
ALT: 16 U/L (ref 0–35)
AST: 19 U/L (ref 0–37)
Albumin: 4.2 g/dL (ref 3.5–5.2)
Alkaline Phosphatase: 74 U/L (ref 39–117)
BUN: 12 mg/dL (ref 6–23)
CO2: 31 mEq/L (ref 19–32)
Calcium: 9.5 mg/dL (ref 8.4–10.5)
Chloride: 105 mEq/L (ref 96–112)
Creatinine, Ser: 0.83 mg/dL (ref 0.40–1.20)
GFR: 75.5 mL/min (ref >60.00)
Glucose, Bld: 83 mg/dL (ref 70–99)
Potassium: 4.5 mEq/L (ref 3.5–5.1)
Sodium: 141 mEq/L (ref 135–145)
Total Bilirubin: 0.5 mg/dL (ref 0.2–1.2)
Total Protein: 6.8 g/dL (ref 6.0–8.3)

## 2021-06-02 LAB — T4, FREE: Free T4: 0.98 ng/dL (ref 0.60–1.60)

## 2021-06-02 LAB — TSH: TSH: 1.02 u[IU]/mL (ref 0.35–5.50)

## 2021-06-02 LAB — VITAMIN D 25 HYDROXY (VIT D DEFICIENCY, FRACTURES): VITD: 22.29 ng/mL — ABNORMAL LOW (ref 30.00–100.00)

## 2021-06-02 NOTE — Assessment & Plan Note (Signed)
Flu shot up to date. Covid-19 up to date. Shingrix complete. Tetanus up to date. Cologuard due in July 2023 will order. Mammogram up to date, pap smear up to date. Counseled about sun safety and mole surveillance. Counseled about the dangers of distracted driving. Given 10 year screening recommendations.  ? ?

## 2021-06-02 NOTE — Progress Notes (Signed)
? ?  Subjective:  ? ?Patient ID: Madison [name], female    DOB: [birth year], 61 y.o.   MRN: [id number] ? ?HPI ?The patient is here for physical. ? ?PMH, [employer], social history reviewed and updated ? ?Review of Systems  ?Constitutional: Negative.   ?HENT: Negative.    ?Eyes: Negative.   ?Respiratory:  Negative for cough, chest tightness and shortness of breath.   ?Cardiovascular:  Negative for chest pain, palpitations and leg swelling.  ?Gastrointestinal:  Negative for abdominal distention, abdominal pain, constipation, diarrhea, nausea and vomiting.  ?Musculoskeletal: Negative.   ?Skin: Negative.   ?Neurological: Negative.   ?Psychiatric/Behavioral: Negative.    ? ?Objective:  ?Physical Exam ?Constitutional:   ?   Appearance: She is well-developed.  ?HENT:  ?   Head: Normocephalic and atraumatic.  ?Cardiovascular:  ?   Rate and Rhythm: Normal rate and regular rhythm.  ?Pulmonary:  ?   Effort: Pulmonary effort is normal. No respiratory distress.  ?   Breath sounds: Normal breath sounds. No wheezing or rales.  ?Abdominal:  ?   General: Bowel sounds are normal. There is no distension.  ?   Palpations: Abdomen is soft.  ?   Tenderness: There is no abdominal tenderness. There is no rebound.  ?Musculoskeletal:  ?   Cervical back: Normal range of motion.  ?Skin: ?   General: Skin is warm and dry.  ?Neurological:  ?   Mental Status: She is alert and oriented to person, place, and time.  ?   Coordination: Coordination normal.  ? ? ?Vitals:  ? 06/02/21 0757  ?BP: 122/78  ?Pulse: 88  ?Resp: 18  ?SpO2: 96%  ?Weight: 170 lb (77.1 kg)  ?Height: '5\' 9"'$  (1.753 m)  ? ?EKG: Rate 57, axis normal, interval normal, sinus, no st or t wave changes, no prior to compare ? ?This visit occurred during the SARS-CoV-2 public health emergency.  Safety protocols were in place, including screening questions prior to the visit, additional usage of staff PPE, and extensive cleaning of exam room while observing appropriate contact time as indicated for  disinfecting solutions.  ? ?Assessment & Plan:  ? ?

## 2021-06-02 NOTE — Assessment & Plan Note (Signed)
Checking TSH and free T4. Seeing endo in April 2023 for dosage adjustment if needed. Continue synthroid 100 mcg daily.  ?

## 2021-06-02 NOTE — Assessment & Plan Note (Signed)
Checking TgAb and thyroglobulin IMA or LCMS and TSH and free T4 to reduce number of blood draws. She will be seeing her endocrinologist in April and will continue synthroid 100 mcg daily.  ?

## 2021-06-02 NOTE — Assessment & Plan Note (Signed)
Checking vitamin D level and adjust as needed. Taking multivitamin currently otc.  ?

## 2021-06-02 NOTE — Patient Instructions (Addendum)
We will send you cologuard this summer to update colon cancer screening. ? ?Your EKG is normal.  ?

## 2021-06-04 LAB — TGAB+THYROGLOBULIN IMA OR LCMS: Thyroglobulin Antibody: <1 IU/mL (ref 0.0–0.9)

## 2021-06-04 LAB — THYROGLOBULIN BY IMA: Thyroglobulin by IMA: <0.1 ng/mL — ABNORMAL LOW (ref 1.5–38.5)

## 2021-06-16 NOTE — Progress Notes (Signed)
Subjective:  ? ?Patient ID: Madison [name], female   DOB: 61 y.o.   MRN: [id number]  ? ?HPI ?Patient states still having some problems with her foot but seems to be gradually better ? ? ?ROS ? ? ?   ?Objective:  ?Physical Exam  ?Neuro vascular status intact discomfort still present but seems to be gradually improving ? ?   ?Assessment:  ?Inflammatory condition with previous neuroma resection I am hoping skin to settle down on its own ? ?   ?Plan:  ?Advised on anti-inflammatories wide shoe gear and time and hopefully this will solve the problem will be seen back as needed may require further treatment or possible second interspace resection at 1 point in future ?   ? ? ?

## 2021-06-22 ENCOUNTER — Ambulatory Visit: Payer: BC Managed Care – PPO | Admitting: Internal Medicine

## 2021-06-23 ENCOUNTER — Other Ambulatory Visit: Payer: Self-pay | Admitting: Internal Medicine

## 2021-06-23 DIAGNOSIS — E890 Postprocedural hypothyroidism: Secondary | ICD-10-CM

## 2021-06-26 ENCOUNTER — Ambulatory Visit: Payer: BC Managed Care – PPO | Admitting: Internal Medicine

## 2021-06-28 ENCOUNTER — Ambulatory Visit (INDEPENDENT_AMBULATORY_CARE_PROVIDER_SITE_OTHER): Payer: BC Managed Care – PPO | Admitting: Internal Medicine

## 2021-06-28 ENCOUNTER — Encounter: Payer: Self-pay | Admitting: Internal Medicine

## 2021-06-28 VITALS — BP 122/78 | HR 70 | Ht 69.0 in | Wt 170.0 lb

## 2021-06-28 DIAGNOSIS — E890 Postprocedural hypothyroidism: Secondary | ICD-10-CM

## 2021-06-28 DIAGNOSIS — C73 Malignant neoplasm of thyroid gland: Secondary | ICD-10-CM | POA: Diagnosis not present

## 2021-06-28 MED ORDER — LEVOTHYROXINE SODIUM 100 MCG PO TABS: 100.0000 ug | ORAL_TABLET | Freq: Every day | ORAL | 3 refills | Status: DC

## 2021-06-28 NOTE — Progress Notes (Signed)
Patient ID: Madison [name], female   DOB: January 23, 1961, 61 y.o.   MRN: [id number] ? ?This visit occurred during the SARS-CoV-2 public health emergency.  Safety protocols were in place, including screening questions prior to the visit, additional usage of staff PPE, and extensive cleaning of exam room while observing appropriate contact time as indicated for disinfecting solutions.  ? ?HPI  ?Madison [name] is a 61 y.o.-year-old female, initially referred by Dr. 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  She is was seeing Dr. Gabriel Carina before.  Last visit with me 1 year ago. ? ?Interval history: ?No medical issues since last visit, except for toe surgery. ?She reports joint pains (knees) for which she puts collagen in her almond milk in am. ?No neck compression symptoms. ? ?Reviewed and addended history: ?She felt a nodule in her neck 03/2017.  She presented to see her PCP who ordered a thyroid ultrasound.  A large thyroid nodule was seen in the left neck.  This was biopsied and the lesion was inconclusive.  She was then sent for left hemithyroidectomy which showed Hurthle cell cancer. ? ?Reviewed patient's chart and Dr. Joycie Peek notes: ?09/10/2017: FNA of the left thyroid nodule: Cystic components (scant cellular material), inconclusive ?12/02/2017: Left hemithyroidectomy-Dr. Harlow Asa. ?Pathology: 3.5 x 2.6 x 2.2 cm Hurthle cell carcinoma with negative margins and no lymphatic invasion but with angioinvasion.  No lymph nodes were submitted then.  Staging: pT2Nx ?12/11/2017: Total thyroidectomy-Dr. Harlow Asa ?Pathology: Right lobe benign ?01/06/2018: RAI treatment with 75 mCi ?Post ablative WBS: 2 foci of uptake in the thyroid bed, no distant uptake ?05/29/2018: Neck ultrasound: Normal changes after thyroidectomy without significant anterior neck adenopathy ?02/16/2019: Whole-body scan: interval resolution of activity in the thyroid bed, no metastases ? ?Her thyroglobulin levels and  antibodies have been undetectable: ?Lab Results  ?Component Value Date  ? THYROGLB <0.1 (L) 06/02/2021  ? THYROGLB <0.1 (L) 06/22/2020  ? THYROGLB <0.1 (L) 06/23/2019  ? THYROGLB <2.0 12/23/2018  ? ?Lab Results  ?Component Value Date  ? THGAB <1.0 06/02/2021  ? THGAB <1.0 06/22/2020  ? THGAB <1.0 06/23/2019  ? THGAB <1.0 12/23/2018  ?05/28/2018: Tg <0.1, ATA <1  ?02/18/2018: Tg <0.1, ATA <1 ? ?Pt denies: ?- feeling nodules in neck ?- hoarseness ?- dysphagia ?- choking ?- SOB with lying down ? ?Postsurgical hypothyroidism ? ?Pt is on levothyroxine 100 mcg daily, taken: ?- in am (5:30 am) ?- fasting ?- at least 30 min from b'fast ?- no Ca, Fe,  PPIs, + multivitamins at night or lunchtime ?- + on Biotin - was off before last set of labs ?- + collagen powder in almond milk later in am ? ?Reviewed her TFTs: ?Lab Results  ?Component Value Date  ? TSH 1.02 06/02/2021  ? TSH 1.23 06/22/2020  ? TSH 0.74 06/23/2019  ? TSH 1.74 12/23/2018  ? TSH 2.03 07/18/2017  ? FREET4 0.98 06/02/2021  ? FREET4 1.13 06/22/2020  ? FREET4 1.05 06/23/2019  ? FREET4 1.07 12/23/2018  ? FREET4 0.83 07/18/2017  ?09/15/2018: TSH 1.738 ?05/28/2018: TSH 0.907 ?02/18/2018: TSH 0.352   ? ?She previously had some palpitations, which resolved. ? ?No family history of thyroid disorders or thyroid cancer. ?No history of radiation therapy to head or neck.  She had radiotherapy as a child after an infected vaccine wound.  ? ?ROS: ?+ see HPI ? ?I reviewed pt's medications, allergies, PMH, social hx, family hx, and changes were documented in the history of present illness. Otherwise,  unchanged from my initial visit note. ? ?Past Medical History:  ?Diagnosis Date  ? Chicken pox   ? Fibroid   ? PONV (postoperative nausea and vomiting)   ? Thyroid neoplasm   ? ?Past Surgical History:  ?Procedure Laterality Date  ? ABLATION    ? AUGMENTATION MAMMAPLASTY    ? BREAST ENHANCEMENT SURGERY    ? 2010  ? BREAST SURGERY    ? breast bx 2015 benign breasts cysts   ? FOOT SURGERY     ? left removed partial nerve   ? THYROID LOBECTOMY Left 12/02/2017  ? Procedure: LEFT THYROID LOBECTOMY;  Surgeon: Armandina Gemma, MD;  Location: [facility];  Service: General;  Laterality: Left;  ? THYROIDECTOMY N/A 12/10/2017  ? Procedure: COMPLETION THYROIDECTOMY;  Surgeon: Armandina Gemma, MD;  Location: [facility];  Service: General;  Laterality: N/A;  ? TONSILLECTOMY    ? TOTAL VAGINAL HYSTERECTOMY    ? fibroids   ? ?Social History  ? ?Socioeconomic History  ? Marital status: Divorced  ?  Spouse name: Not on file  ? Number of children: Not on file  ? Years of education: Not on file  ? Highest education level: Not on file  ?Occupational History  ? Not on file  ?Tobacco Use  ? Smoking status: Former  ?  Types: Cigarettes  ?  Quit date: 32  ?  Years since quitting: 35.3  ? Smokeless tobacco: Never  ? Tobacco comments:  ?  smoked 13 years ago 1/2 ppd   ?Vaping Use  ? Vaping Use: Never used  ?Substance and Sexual Activity  ? Alcohol use: Yes  ?  Comment: occasionally   ? Drug use: Never  ? Sexual activity: Not Currently  ?  Birth control/protection: Surgical  ?  Comment: Hysterectomy 2008   ?Other Topics Concern  ? Not on file  ?Social History Narrative  ? Safe in relationship   ? Owns guns   ? Wears seat belt   ? 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 2 kids   ? ?Social Determinants of Health  ? ?Financial Resource Strain: Not on file  ?Food Insecurity: Not on file  ?Transportation Needs: Not on file  ?Physical Activity: Not on file  ?Stress: Not on file  ?Social Connections: Not on file  ?Intimate Partner Violence: Not on file  ? ?Current Outpatient Medications on File Prior to Visit  ?Medication Sig Dispense Refill  ? levothyroxine (SYNTHROID) 100 MCG tablet TAKE 1 TABLET BY MOUTH EVERY DAY 30 tablet 0  ? Multiple Vitamin (MULTIVITAMIN) tablet Take 1 tablet by mouth daily.    ? ?No current facility-administered medications on file prior to visit.  ? ?Allergies  ?Allergen Reactions  ? Adhesive [Tape]   ?  Had moles removes on back and  when they put bandage over it , it caused bumpiness underneath the bandage   ? ?Family History  ?Problem Relation Age of Onset  ? Diabetes Mother   ? Heart disease Mother   ? Depression Mother   ? Hip fracture Mother   ? Alcohol abuse Father   ? Cancer Father   ?     mesthelioma house with asbestos   ? Skin cancer Sister   ? Skin cancer Brother   ? Bladder Cancer Maternal Aunt   ? Breast cancer Paternal Aunt   ? ?PE: ?BP 122/78   Pulse 70   Ht '5\' 9"'$  (1.753 m)   Wt 170 lb (77.1 kg)   BMI  25.10 kg/m?  ?Wt Readings from Last 3 Encounters:  ?06/28/21 170 lb (77.1 kg)  ?06/02/21 170 lb (77.1 kg)  ?09/14/20 169 lb (76.7 kg)  ? ?Constitutional: Normal weight, in NAD ?Eyes: PERRLA, EOMI, no exophthalmos ?ENT: moist mucous membranes, no neck masses palpated, + thyroidectomy scar healed, no cervical lymphadenopathy ?Cardiovascular: RRR, No MRG ?Respiratory: CTA B ?Musculoskeletal: no deformities, strength intact in all 4 ?Skin: moist, warm, no rashes ?Neurological: no tremor with outstretched hands, DTR normal in all 4 ? ?ASSESSMENT: ?1. Thyroid cancer (Hurthle cell) ?- see HPI ? ?2. Postsurgical Hypothyroidism ? ?PLAN:  ?1.  Hurthle cell thyroid cancer ?- Patient with history of Hurthle cell thyroid cancer without lymphatic invasion but with angioinvasion.  Because of this, she has a slightly higher risk of metastasis.  Also, both of thyroid cancer is a more unusual type of cancer, which may be less responsive to RAI treatment and this will be affected by thyroglobulin levels.  However, in general, thyroid cancer is a slow-growing cancer, with good prognosis.  At the end of 2020, due to the slightly higher risk compared to the classic papillary thyroid cancer, we checked a whole-body scan and this was negative for metastasis or recurrences.  Also, there was no increased activity in the thyroid bed.  Therefore, from now on, we can follow her with neck ultrasounds on an as-needed basis. ?- at this visit, she does not have  any neck masses palpated or neck compression symptoms ?- she just had a thyroglobulin and thyroglobulin antibodies checked by PCP with the [facility] assay and they were both undetectable ?- I will see the

## 2021-06-28 NOTE — Patient Instructions (Addendum)
?  Please continue Levothyroxine 100 mcg daily. ? ?Take the thyroid hormone every day, with water, at least 30 minutes before breakfast, separated by at least 4 hours from: ?- acid reflux medications ?- calcium ?- iron ?- multivitamins ? ?Please come back for a follow-up appointment in 1 year. ?

## 2021-07-04 DIAGNOSIS — L578 Other skin changes due to chronic exposure to nonionizing radiation: Secondary | ICD-10-CM | POA: Diagnosis not present

## 2021-07-04 DIAGNOSIS — L821 Other seborrheic keratosis: Secondary | ICD-10-CM | POA: Diagnosis not present

## 2021-07-04 DIAGNOSIS — D1801 Hemangioma of skin and subcutaneous tissue: Secondary | ICD-10-CM | POA: Diagnosis not present

## 2021-07-04 DIAGNOSIS — L570 Actinic keratosis: Secondary | ICD-10-CM | POA: Diagnosis not present

## 2021-07-04 DIAGNOSIS — L814 Other melanin hyperpigmentation: Secondary | ICD-10-CM | POA: Diagnosis not present

## 2021-07-05 ENCOUNTER — Ambulatory Visit: Payer: BC Managed Care – PPO

## 2021-07-05 ENCOUNTER — Ambulatory Visit (INDEPENDENT_AMBULATORY_CARE_PROVIDER_SITE_OTHER): Payer: BC Managed Care – PPO | Admitting: Podiatry

## 2021-07-05 DIAGNOSIS — D3610 Benign neoplasm of peripheral nerves and autonomic nervous system, unspecified: Secondary | ICD-10-CM

## 2021-07-05 DIAGNOSIS — Z9889 Other specified postprocedural states: Secondary | ICD-10-CM

## 2021-07-06 NOTE — Progress Notes (Signed)
Subjective:  ? ?Patient ID: Madison [name], female   DOB: 61 y.o.   MRN: [id number]  ? ?HPI ?Patient states doing much better very pleased with no longer having the discomfort he had and feels that this has been successful in solving the problem ? ? ?ROS ? ? ?   ?Objective:  ?Physical Exam  ?Neurovascular status intact negative Bevelyn Buckles' sign noted wound edges right healed well wound edges well coapted pain second interspace has resolved and around the metatarsal phalangeal joint ? ?   ?Assessment:  ?Doing well post neurectomy third interspace right ? ?   ?Plan:  ?Reviewed condition recommended the continuation of wider shoe gear patient will be seen back as needed encouraged to call questions concerns ?   ? ? ?

## 2021-08-02 NOTE — Progress Notes (Signed)
[place] Phone: [phone] Subjective:   Madison [name], am serving as a scribe for Dr. Hulan Saas.  This visit occurred during the SARS-CoV-2 public health emergency.  Safety protocols were in place, including screening questions prior to the visit, additional usage of staff PPE, and extensive cleaning of exam room while observing appropriate contact time as indicated for disinfecting solutions.    I'm seeing this patient by the request  of:  Hoyt Koch, MD  CC: Bilateral knee pain  [id number]  Madison [name] is a 61 y.o. female coming in with complaint of B knee pain. Patient states that her pain is over lateral aspect and behind patella. Likes to walk and do squats. Pain chronic but worsening over past 2 months. Has not tried anything of pain. Notes swelling over lateral aspect at their worst.        Past Medical History:  Diagnosis Date   Chicken pox    Fibroid    PONV (postoperative nausea and vomiting)    Thyroid neoplasm    Past Surgical History:  Procedure Laterality Date   ABLATION     AUGMENTATION MAMMAPLASTY     BREAST ENHANCEMENT SURGERY     2010   BREAST SURGERY     breast bx 2015 benign breasts cysts    FOOT SURGERY     left removed partial nerve    THYROID LOBECTOMY Left 12/02/2017   Procedure: LEFT THYROID LOBECTOMY;  Surgeon: Armandina Gemma, MD;  Location: [facility];  Service: General;  Laterality: Left;   THYROIDECTOMY N/A 12/10/2017   Procedure: COMPLETION THYROIDECTOMY;  Surgeon: Armandina Gemma, MD;  Location: [facility];  Service: General;  Laterality: N/A;   TONSILLECTOMY     TOTAL VAGINAL HYSTERECTOMY     fibroids    Social History   Socioeconomic History   Marital status: Divorced    Spouse name: Not on file   Number of children: Not on file   Years of education: Not on file   Highest education level: Not on file  Occupational History   Not on file  Tobacco Use    Smoking status: Former    Types: Cigarettes    Quit date: 43    Years since quitting: 35.4   Smokeless tobacco: Never   Tobacco comments:    smoked 13 years ago 1/2 ppd   Vaping Use   Vaping Use: Never used  Substance and Sexual Activity   Alcohol use: Yes    Comment: occasionally    Drug use: Never   Sexual activity: Not Currently    Birth control/protection: Surgical    Comment: Hysterectomy 2008   Other Topics Concern   Not on file  Social History Narrative   Safe in relationship    Owns guns    Wears seat Teacher, 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    Social Determinants of Health   Financial Resource Strain: Not on file  Food Insecurity: Not on file  Transportation Needs: Not on file  Physical Activity: Not on file  Stress: Not on file  Social Connections: Not on file   Allergies  Allergen Reactions   Adhesive [Tape]     Had moles removes on back and when they put bandage over it , it caused bumpiness underneath the bandage    Family History  Problem Relation Age of Onset   Diabetes Mother  Heart disease Mother    Depression Mother    Hip fracture Mother    Alcohol abuse Father    Cancer Father        mesthelioma house with asbestos    Skin cancer Sister    Skin cancer Brother    Bladder Cancer Maternal Aunt    Breast cancer Paternal Aunt     Current Outpatient Medications (Endocrine & Metabolic):    levothyroxine (SYNTHROID) 100 MCG tablet, Take 1 tablet (100 mcg total) by mouth daily.      Current Outpatient Medications (Other):    Multiple Vitamin (MULTIVITAMIN) tablet, Take 1 tablet by mouth daily.   Vitamin D, Ergocalciferol, (DRISDOL) 1.25 MG (50000 UNIT) CAPS capsule, Take 1 capsule (50,000 Units total) by mouth every 7 (seven) days.   Reviewed prior external information including notes and imaging from  primary care provider As well as notes that were available from care everywhere and other healthcare systems.  Past medical  history, social, surgical and family history all reviewed in electronic medical record.  [name] pertanent information unless stated regarding to the chief complaint.   Review of Systems:  [name] headache, visual changes, nausea, vomiting, diarrhea, constipation, dizziness, abdominal pain, skin rash, fevers, chills, night sweats, weight loss, swollen lymph nodes, body aches, joint swelling, chest pain, shortness of breath, mood changes. POSITIVE muscle aches  Objective  Blood pressure 116/88, height '5\' 9"'$  (1.753 m), weight 170 lb (77.1 kg).   General: [name] apparent distress alert and oriented x3 mood and affect normal, dressed appropriately.  HEENT: Pupils equal, extraocular movements intact  Respiratory: Patient's speak in full sentences and does not appear short of breath  Cardiovascular: [name] lower extremity edema, non tender, [name] erythema  Gait normal with good balance and coordination.  MSK: Patient has a knee arthritis bilaterally.  Crepitus noted.  Trace effusion noted.  Lacks last 5 degrees of flexion.  Limited muscular skeletal ultrasound was performed and interpreted by Hulan Saas, M  Limited ultrasound of patient's knee showed the patient does have trace effusion of the patellofemoral joint left greater than right.  The patient does have some narrowing of the patellofemoral joint left greater than right as well. Impression: Mild effusion bilaterally with patellofemoral arthritis    Impression and Recommendations:    The above documentation has been reviewed and is accurate and complete Lyndal Pulley, DO

## 2021-08-03 ENCOUNTER — Ambulatory Visit: Payer: Self-pay

## 2021-08-03 ENCOUNTER — Ambulatory Visit (INDEPENDENT_AMBULATORY_CARE_PROVIDER_SITE_OTHER): Payer: BC Managed Care – PPO

## 2021-08-03 ENCOUNTER — Ambulatory Visit (INDEPENDENT_AMBULATORY_CARE_PROVIDER_SITE_OTHER): Payer: BC Managed Care – PPO | Admitting: Family Medicine

## 2021-08-03 VITALS — BP 116/88 | Ht 69.0 in | Wt 170.0 lb

## 2021-08-03 DIAGNOSIS — M25561 Pain in right knee: Secondary | ICD-10-CM | POA: Diagnosis not present

## 2021-08-03 DIAGNOSIS — E559 Vitamin D deficiency, unspecified: Secondary | ICD-10-CM | POA: Diagnosis not present

## 2021-08-03 DIAGNOSIS — M25562 Pain in left knee: Secondary | ICD-10-CM

## 2021-08-03 DIAGNOSIS — M1710 Unilateral primary osteoarthritis, unspecified knee: Secondary | ICD-10-CM

## 2021-08-03 MED ORDER — VITAMIN D (ERGOCALCIFEROL) 1.25 MG (50000 UNIT) PO CAPS: 50000.0000 [IU] | ORAL_CAPSULE | ORAL | 0 refills | Status: DC

## 2021-08-03 NOTE — Patient Instructions (Addendum)
Xray  Trupull Lite L Once weekly Vit D Turmeric '500mg'$  daily Tart Cherry Extract '1200mg'$  at night Ice 20 min 2x a day Exercises 3x a week See me again in 6-8 weeks

## 2021-08-03 NOTE — Assessment & Plan Note (Signed)
Bilateral.  Discussed with patient icing regimen and home exercises, discussed which activities to do discussed with patient continue to work on VMO stability.  Follow-up again in 6 to 8 weeks otherwise if worsening pain consider formal physical therapy and injections.

## 2021-08-03 NOTE — Assessment & Plan Note (Signed)
Once weekly prescription dose given.

## 2021-09-13 NOTE — Progress Notes (Signed)
Madison [name] [place] [address] [place] Phone: [phone] Subjective:   IVilma Meckel, am serving as a scribe for Dr. Hulan Saas.  I'm seeing this patient by the request  of:  Hoyt Koch, MD  CC: Bilateral knee   LKG:[id number]  08/03/2021 Bilateral.  Discussed with patient icing regimen and home exercises, discussed which activities to do discussed with patient continue to work on VMO stability.  Follow-up again in 6 to 8 weeks otherwise if worsening pain consider formal physical therapy and injections.  Update 09/15/2021 Madison [name] is a 61 y.o. female coming in with complaint of B knee pain. Patient states feeling a little better. Can still feel when going down steps. Doing home exercises. No new complaints.       Past Medical History:  Diagnosis Date   Chicken pox    Fibroid    PONV (postoperative nausea and vomiting)    Thyroid neoplasm    Past Surgical History:  Procedure Laterality Date   ABLATION     AUGMENTATION MAMMAPLASTY     BREAST ENHANCEMENT SURGERY     2010   BREAST SURGERY     breast bx 2015 benign breasts cysts    FOOT SURGERY     left removed partial nerve    THYROID LOBECTOMY Left 12/02/2017   Procedure: LEFT THYROID LOBECTOMY;  Surgeon: Armandina Gemma, MD;  Location: [facility];  Service: General;  Laterality: Left;   THYROIDECTOMY N/A 12/10/2017   Procedure: COMPLETION THYROIDECTOMY;  Surgeon: Armandina Gemma, MD;  Location: [facility];  Service: General;  Laterality: N/A;   TONSILLECTOMY     TOTAL VAGINAL HYSTERECTOMY     fibroids    Social History   Socioeconomic History   Marital status: Divorced    Spouse name: Not on file   Number of children: Not on file   Years of education: Not on file   Highest education level: Not on file  Occupational History   Not on file  Tobacco Use   Smoking status: Former    Types: Cigarettes    Quit date: 1988    Years since quitting: 35.5   Smokeless tobacco:  Never   Tobacco comments:    smoked 13 years ago 1/2 ppd   Vaping Use   Vaping Use: Never used  Substance and Sexual Activity   Alcohol use: Yes    Comment: occasionally    Drug use: Never   Sexual activity: Not Currently    Birth control/protection: Surgical    Comment: Hysterectomy 2008   Other Topics Concern   Not on file  Social History Narrative   Safe in relationship    Owns guns    Wears seat Teacher, 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    Social Determinants of Health   Financial Resource Strain: Not on file  Food Insecurity: Not on file  Transportation Needs: Not on file  Physical Activity: Not on file  Stress: Not on file  Social Connections: Not on file   Allergies  Allergen Reactions   Adhesive [Tape]     Had moles removes on back and when they put bandage over it , it caused bumpiness underneath the bandage    Family History  Problem Relation Age of Onset   Diabetes Mother    Heart disease Mother    Depression Mother    Hip fracture Mother    Alcohol abuse Father  Cancer Father        mesthelioma house with asbestos    Skin cancer Sister    Skin cancer Brother    Bladder Cancer Maternal Aunt    Breast cancer Paternal Aunt     Current Outpatient Medications (Endocrine & Metabolic):    levothyroxine (SYNTHROID) 100 MCG tablet, Take 1 tablet (100 mcg total) by mouth daily.      Current Outpatient Medications (Other):    Multiple Vitamin (MULTIVITAMIN) tablet, Take 1 tablet by mouth daily.   Vitamin D, Ergocalciferol, (DRISDOL) 1.25 MG (50000 UNIT) CAPS capsule, Take 1 capsule (50,000 Units total) by mouth every 7 (seven) days.   Review of Systems:  No headache, visual changes, nausea, vomiting, diarrhea, constipation, dizziness, abdominal pain, skin rash, fevers, chills, night sweats, weight loss, swollen lymph nodes, body aches, joint swelling, chest pain, shortness of breath, mood changes. POSITIVE muscle aches  Objective  Blood pressure  116/74, pulse 71, height '5\' 9"'$  (1.753 m), weight 171 lb (77.6 kg), SpO2 98 %.   General: No apparent distress alert and oriented x3 mood and affect normal, dressed appropriately.  HEENT: Pupils equal, extraocular movements intact  Respiratory: Patient's speak in full sentences and does not appear short of breath  Cardiovascular: No lower extremity edema, non tender, no erythema  Bilateral knees do show some lateral tracking noted.  No significant effusion noted at the moment.  No significant instability with valgus or varus stress.    Impression and Recommendations:     The above documentation has been reviewed and is accurate and complete Lyndal Pulley, DO

## 2021-09-15 ENCOUNTER — Ambulatory Visit (INDEPENDENT_AMBULATORY_CARE_PROVIDER_SITE_OTHER): Payer: BC Managed Care – PPO | Admitting: Family Medicine

## 2021-09-15 DIAGNOSIS — M1710 Unilateral primary osteoarthritis, unspecified knee: Secondary | ICD-10-CM | POA: Diagnosis not present

## 2021-09-15 NOTE — Assessment & Plan Note (Signed)
Patient is doing significantly better at this time.  And has not used any significant medications.  Continuing to be active.  We discussed which activities to do and which ones to avoid.  Patient can follow-up with me as needed

## 2021-09-15 NOTE — Patient Instructions (Signed)
Doing great 3IBU 3x a day for 3 days for flares Continue exercises and vit See me in 3 months

## 2021-09-27 ENCOUNTER — Ambulatory Visit: Payer: BC Managed Care – PPO | Admitting: Obstetrics and Gynecology

## 2021-09-28 NOTE — Progress Notes (Signed)
61 y.o. [id number] Divorced White or Caucasian Not Hispanic or Latino female here for annual exam.  H/O TVH.    H/O thyroidectomy for thyroid cancer in 2019, then treated with radioactive iodine.     No LMP recorded. Patient has had a hysterectomy.          Sexually active: No.  The current method of family planning is status post hysterectomy and post menopausal status.    Exercising: Yes.    Gym/ health club routine includes cardio and light weights. Smoker:  no  Health Maintenance: Pap:  2018 at [facility] in [place]  History of abnormal Pap:  no MMG:  10/04/20 Bi-rads 1 neg  BMD:  11/02/19 osteopenic,  T score -1.3, Frax 15.5/0.6%. On synthroid, f/u DEXA in 2 years. Colonoscopy: Never, tried to have one, got very sick with the prep Cologuard: negative 10/02/18 TDaP:  07/05/14    reports that she quit smoking about 35 years ago. Her smoking use included cigarettes. She has never used smokeless tobacco. She reports current alcohol use. She reports that she does not use drugs. Accountant. 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 5 grand children (104, 4 year old twins, 10 and 8), 3 in [place], 2 in [place]. 3 sister's, 2 are local.  She moved in with her Mom to help take care of her. Sister also moved in to help. She needs around the clock care.   Past Medical History:  Diagnosis Date   Chicken pox    Fibroid    PONV (postoperative nausea and vomiting)    Thyroid neoplasm     Past Surgical History:  Procedure Laterality Date   ABLATION     AUGMENTATION MAMMAPLASTY     BREAST ENHANCEMENT SURGERY     2010   BREAST SURGERY     breast bx 2015 benign breasts cysts    FOOT SURGERY     left removed partial nerve    THYROID LOBECTOMY Left 12/02/2017   Procedure: LEFT THYROID LOBECTOMY;  Surgeon: Armandina Gemma, MD;  Location: [facility];  Service: General;  Laterality: Left;   THYROIDECTOMY N/A 12/10/2017   Procedure: COMPLETION THYROIDECTOMY;  Surgeon: Armandina Gemma, MD;  Location: [facility];  Service:  General;  Laterality: N/A;   TONSILLECTOMY     TOTAL VAGINAL HYSTERECTOMY     fibroids     Current Outpatient Medications  Medication Sig Dispense Refill   levothyroxine (SYNTHROID) 100 MCG tablet Take 1 tablet (100 mcg total) by mouth daily. 90 tablet 3   Multiple Vitamin (MULTIVITAMIN) tablet Take 1 tablet by mouth daily.     TART CHERRY PO Take by mouth.     Turmeric (QC TUMERIC COMPLEX PO) Take by mouth.     No current facility-administered medications for this visit.    Family History  Problem Relation Age of Onset   Diabetes Mother    Heart disease Mother    Depression Mother    Hip fracture Mother    Alcohol abuse Father    Cancer Father        mesthelioma house with asbestos    Skin cancer Sister    Skin cancer Brother    Bladder Cancer Maternal Aunt    Breast cancer Paternal Aunt     Review of Systems  All other systems reviewed and are negative.   Exam:   BP 110/70   Pulse 78   Ht '5\' 9"'$  (1.753 m)   Wt 171 lb (77.6 kg)   SpO2 100%  BMI 25.25 kg/m   Weight change: '@WEIGHTCHANGE'$ @ Height:   Height: '5\' 9"'$  (175.3 cm)  Ht Readings from Last 3 Encounters:  10/05/21 '5\' 9"'$  (1.753 m)  09/15/21 '5\' 9"'$  (1.753 m)  08/03/21 '5\' 9"'$  (1.753 m)    General appearance: alert, cooperative and appears stated age Head: Normocephalic, without obvious abnormality, atraumatic Neck: no adenopathy, supple, symmetrical, trachea midline and thyroid normal to inspection and palpation Lungs: clear to auscultation bilaterally Cardiovascular: regular rate and rhythm Breasts: normal appearance, no masses or tenderness, bilaterally soft implants Abdomen: soft, non-tender; non distended,  no masses,  no organomegaly Extremities: extremities normal, atraumatic, no cyanosis or edema Skin: Skin color, texture, turgor normal. No rashes or lesions Lymph nodes: Cervical, supraclavicular, and axillary nodes normal. No abnormal inguinal nodes palpated Neurologic: Grossly normal   Pelvic:  External genitalia:  no lesions              Urethra:  normal appearing urethra with no masses, tenderness or lesions              Bartholins and Skenes: normal                 Vagina: normal appearing vagina with normal color and discharge, no lesions              Cervix: absent               Bimanual Exam:  Uterus:  uterus absent              Adnexa: no mass, fullness, tenderness               Rectovaginal: Confirms               Anus:  normal sphincter tone, no lesions  Gae Dry, CMA chaperoned for the exam.  1. Well woman exam Discussed breast self exam Labs with primary Mammogram due, she will schedule  2. History of osteopenia Discussed calcium and vit D intake - DG Bone Density; Future  3. Colon cancer screening Discussed options - Ambulatory referral to Gastroenterology  4. Hypoestrogenism - DG Bone Density; Future

## 2021-10-05 ENCOUNTER — Ambulatory Visit (INDEPENDENT_AMBULATORY_CARE_PROVIDER_SITE_OTHER): Payer: BC Managed Care – PPO | Admitting: Obstetrics and Gynecology

## 2021-10-05 ENCOUNTER — Encounter: Payer: Self-pay | Admitting: Obstetrics and Gynecology

## 2021-10-05 VITALS — BP 110/70 | HR 78 | Ht 69.0 in | Wt 171.0 lb

## 2021-10-05 DIAGNOSIS — Z01419 Encounter for gynecological examination (general) (routine) without abnormal findings: Secondary | ICD-10-CM

## 2021-10-05 DIAGNOSIS — Z8739 Personal history of other diseases of the musculoskeletal system and connective tissue: Secondary | ICD-10-CM | POA: Diagnosis not present

## 2021-10-05 DIAGNOSIS — Z1211 Encounter for screening for malignant neoplasm of colon: Secondary | ICD-10-CM

## 2021-10-05 DIAGNOSIS — E2839 Other primary ovarian failure: Secondary | ICD-10-CM | POA: Diagnosis not present

## 2021-10-05 NOTE — Patient Instructions (Signed)

## 2021-10-06 ENCOUNTER — Telehealth: Payer: Self-pay

## 2021-10-06 NOTE — Telephone Encounter (Signed)
FYI. Pt calling to report that [facility] doesn't have any appt available for mammo and DEXA until 02/2022. She tried [facility] and they found her a slot on 10/20/21 @ 730 but they needed an order.  Will fill out and have JC authorize since [place] not in office today.   Order faxed successfully.  Scheduled for 10/13/21 @ 3pm   Pt notified and voiced understanding.

## 2021-10-19 DIAGNOSIS — M21621 Bunionette of right foot: Secondary | ICD-10-CM | POA: Diagnosis not present

## 2021-10-19 DIAGNOSIS — G5761 Lesion of plantar nerve, right lower limb: Secondary | ICD-10-CM | POA: Diagnosis not present

## 2021-10-25 ENCOUNTER — Encounter: Payer: Self-pay | Admitting: Podiatry

## 2021-10-25 ENCOUNTER — Ambulatory Visit (INDEPENDENT_AMBULATORY_CARE_PROVIDER_SITE_OTHER): Payer: BC Managed Care – PPO

## 2021-10-25 ENCOUNTER — Ambulatory Visit (INDEPENDENT_AMBULATORY_CARE_PROVIDER_SITE_OTHER): Payer: BC Managed Care – PPO | Admitting: Podiatry

## 2021-10-25 DIAGNOSIS — Z9889 Other specified postprocedural states: Secondary | ICD-10-CM | POA: Diagnosis not present

## 2021-10-25 DIAGNOSIS — M7751 Other enthesopathy of right foot: Secondary | ICD-10-CM

## 2021-10-25 MED ORDER — TRIAMCINOLONE ACETONIDE 10 MG/ML IJ SUSP
10.0000 mg | Freq: Once | INTRAMUSCULAR | Status: AC
  Administered 2021-10-25: 10 mg

## 2021-10-26 DIAGNOSIS — Z1231 Encounter for screening mammogram for malignant neoplasm of breast: Secondary | ICD-10-CM | POA: Diagnosis not present

## 2021-10-26 NOTE — Progress Notes (Signed)
Subjective:   Patient ID: Madison [name], female   DOB: 61 y.o.   MRN: [id number]   HPI Patient presents stating the third metatarsal third interspace seems good but she is developed pain around the second metatarsal phalangeal joint that is been moderate but bothersome   ROS      Objective:  Physical Exam  Neurovascular status intact incision site third interspace right healing well inflammation now located around the second metatarsal phalangeal joint slightly into the second interspace right     Assessment:  Improved third MPJ right with neuroma excision and third joint surface but does appear to have inflammation second MPJ     Plan:  Explained condition sterile prep and injected periarticular around the second MPJ 3 mg Dexasone Kenalog 5 mg Xylocaine also catching the second interspace.  Reappoint to recheck may require aspiration of joint or other treatment depending on response.  X-rays indicate that there is no signs of fracture or other bony pathology with condition

## 2021-11-01 ENCOUNTER — Encounter: Payer: Self-pay | Admitting: Obstetrics and Gynecology

## 2021-11-01 NOTE — Telephone Encounter (Signed)
Mammo done 10/26/2021. Results WNL.   Spoke with pt about not receiving her DEXA report. Pt stated that she was scheduled for one @ [facility] but when she was at appt, they asked her when her last one was and she wasn't sure. She thought her last one was maybe on year ago and tech told her that her insurance wouldn't cover it if it was only one year ago so pt didn't have imaging done.   Pt informed in our system, it shows that her last was @ [facility] and done on 11/02/2019. So exactly two years today in case she wanted to call [facility] back and make another appt for DEXA scan. Pt informed to let us know if [facility] would need a new DEXA order.   Pt voiced understanding.

## 2021-11-14 ENCOUNTER — Telehealth: Payer: Self-pay

## 2021-11-14 ENCOUNTER — Other Ambulatory Visit: Payer: Self-pay

## 2021-11-14 DIAGNOSIS — Z1211 Encounter for screening for malignant neoplasm of colon: Secondary | ICD-10-CM

## 2021-11-14 NOTE — Telephone Encounter (Signed)
Patient called because Dr. Talbert Nan placed referral for colonoscopy with Dr. Carlean Purl. His office called and advised that Dr. Carlean Purl no longer taking new patients.  A family member recently saw Dr. Hilarie Fredrickson and really liked him and she asked if we could place referral with him.  I removed the [facility] referral from the workqueue and placed new referral with Dr. Hilarie Fredrickson. Patient was provided [facility] phone # and told she can call tomorrow to schedule.

## 2021-11-23 NOTE — Telephone Encounter (Signed)
LDVM on machine per DPR re: trying to f/u with her in regards to her DEXA scan w/ [facility].

## 2021-12-01 ENCOUNTER — Encounter: Payer: Self-pay | Admitting: Podiatry

## 2021-12-01 ENCOUNTER — Ambulatory Visit (INDEPENDENT_AMBULATORY_CARE_PROVIDER_SITE_OTHER): Payer: BC Managed Care – PPO | Admitting: Podiatry

## 2021-12-01 DIAGNOSIS — M7751 Other enthesopathy of right foot: Secondary | ICD-10-CM

## 2021-12-01 DIAGNOSIS — D3610 Benign neoplasm of peripheral nerves and autonomic nervous system, unspecified: Secondary | ICD-10-CM | POA: Diagnosis not present

## 2021-12-03 NOTE — Progress Notes (Signed)
Subjective:   Patient ID: Madison [name], female   DOB: 61 y.o.   MRN: [id number]   HPI Patient presents stating I am still having pain.  Stated that the last injection gave her short-term relief with reoccurrence and it gets sharp and it hurts when she walks and she is trying to walk several times a day for activity   ROS      Objective:  Physical Exam  Vascular status intact with inflammation pain around the second metatarsal phalangeal joint right with fluid buildup around the joint surface that is painful when pressed and also has symptoms that seem to expand into the second interspace right with history of neuroma third right that resolved the pain she was getting in that area     Assessment:  Appears to be possibly more of a inflammatory capsulitis with an elongated second metatarsal also with the possibility that there may be neuroma symptomatology of the second interspace right foot     Plan:  H&P reviewed conditions and discussed at great length.  We have not been able to achieve success trying to work on this and it continues to bother her and again appears to be more around the second metatarsal phalangeal joint and into the second interspace.  Patient wants to pursue surgical intervention and I do think long-term would be in her best interest and at this point I allowed her to read consent form for correction reviewing shortening osteotomy and exploration with possible neurectomy second interspace right foot.  She wants surgery and signed consent form after extensive review understanding there is no guarantee this will solve the problem but is willing to go for surgery.  I did dispense air fracture walker which was fitted properly for her today to use during the postoperative period and all questions were answered

## 2021-12-05 ENCOUNTER — Telehealth: Payer: Self-pay | Admitting: Urology

## 2021-12-05 NOTE — Telephone Encounter (Signed)
DOS - 12/12/21  METATARSAL OSTEOTOMY 2ND RIGHT --- 84835 NEURECTOMY 2ND RIGHT --- 28080  [employer] EFFECTIVE DATE - 12/17/20  PLAN DEDUCTIBLE - $5,000.00 W/ $0.00 REMAINING OUT OF POCKET - $5,000.00 W/ $0.00 REMAINING COINSURANCE - 0% COPAY - $0.00   NO PRIOR AUTH REQUIRED.

## 2021-12-11 MED ORDER — HYDROCODONE-ACETAMINOPHEN 10-325 MG PO TABS: 1.0000 | ORAL_TABLET | Freq: Three times a day (TID) | ORAL | 0 refills | Status: AC | PRN

## 2021-12-11 NOTE — Addendum Note (Signed)
Addended by: Wallene Huh on: 12/11/2021 04:43 PM   Modules accepted: Orders

## 2021-12-12 DIAGNOSIS — M21541 Acquired clubfoot, right foot: Secondary | ICD-10-CM | POA: Diagnosis not present

## 2021-12-12 DIAGNOSIS — M205X1 Other deformities of toe(s) (acquired), right foot: Secondary | ICD-10-CM | POA: Diagnosis not present

## 2021-12-12 DIAGNOSIS — G5761 Lesion of plantar nerve, right lower limb: Secondary | ICD-10-CM | POA: Diagnosis not present

## 2021-12-13 ENCOUNTER — Ambulatory Visit: Payer: BC Managed Care – PPO | Admitting: Family Medicine

## 2021-12-15 DIAGNOSIS — Z23 Encounter for immunization: Secondary | ICD-10-CM | POA: Diagnosis not present

## 2021-12-18 ENCOUNTER — Ambulatory Visit (INDEPENDENT_AMBULATORY_CARE_PROVIDER_SITE_OTHER): Payer: BC Managed Care – PPO | Admitting: Podiatry

## 2021-12-18 ENCOUNTER — Ambulatory Visit (INDEPENDENT_AMBULATORY_CARE_PROVIDER_SITE_OTHER): Payer: BC Managed Care – PPO

## 2021-12-18 ENCOUNTER — Encounter: Payer: Self-pay | Admitting: Podiatry

## 2021-12-18 DIAGNOSIS — Z9889 Other specified postprocedural states: Secondary | ICD-10-CM

## 2021-12-18 DIAGNOSIS — M7751 Other enthesopathy of right foot: Secondary | ICD-10-CM

## 2021-12-19 NOTE — Progress Notes (Signed)
Subjective:   Patient ID: Madison [name], female   DOB: 61 y.o.   MRN: [id number]   HPI Patient presents stating doing very well with surgery very pleased   ROS      Objective:  Physical Exam  Neurovascular status intact with good healing of the incision site second metatarsal after having shortening osteotomy removal of neuroma second interspace     Assessment:  Doing well post osteotomy removal of neuroma     Plan:  Sterile prep compression applied continue elevation and immobilization and reappoint 4 weeks or earlier if needed  X-rays indicate screw in place good alignment of the second metatarsal with adequate shortening

## 2021-12-21 NOTE — Telephone Encounter (Signed)
Pt reports she is scheduled for DEXA on 01/10/22. Had to push out due to recent foot surgery. Also had to get new insurance on 12/17/2021. Desires to check with them about making sure her imaging will be covered but appt for DEXA is currently scheduled.

## 2021-12-22 ENCOUNTER — Encounter: Payer: Self-pay | Admitting: Podiatry

## 2022-01-10 DIAGNOSIS — M85852 Other specified disorders of bone density and structure, left thigh: Secondary | ICD-10-CM | POA: Diagnosis not present

## 2022-01-10 DIAGNOSIS — M85851 Other specified disorders of bone density and structure, right thigh: Secondary | ICD-10-CM | POA: Diagnosis not present

## 2022-01-10 DIAGNOSIS — Z780 Asymptomatic menopausal state: Secondary | ICD-10-CM | POA: Diagnosis not present

## 2022-01-11 ENCOUNTER — Ambulatory Visit (INDEPENDENT_AMBULATORY_CARE_PROVIDER_SITE_OTHER): Payer: BC Managed Care – PPO

## 2022-01-11 ENCOUNTER — Ambulatory Visit (INDEPENDENT_AMBULATORY_CARE_PROVIDER_SITE_OTHER): Payer: BC Managed Care – PPO | Admitting: Podiatry

## 2022-01-11 ENCOUNTER — Encounter: Payer: Self-pay | Admitting: Obstetrics and Gynecology

## 2022-01-11 ENCOUNTER — Encounter: Payer: Self-pay | Admitting: Podiatry

## 2022-01-11 DIAGNOSIS — Z9889 Other specified postprocedural states: Secondary | ICD-10-CM | POA: Diagnosis not present

## 2022-01-12 NOTE — Telephone Encounter (Signed)
DEXA performed on 01/10/22. Results send to [place] for review.

## 2022-01-14 NOTE — Progress Notes (Signed)
Subjective:   Patient ID: Madison [name], female   DOB: 61 y.o.   MRN: [id number]   HPI Patient states doing great after having surgery right   ROS      Objective:  Physical Exam  No neurovascular status intact negative Bevelyn Buckles' sign noted wound edges well coapted no discomfort like she was experiencing prior to surgery     Assessment:  Appears to be doing well post foot surgery right     Plan:  H&P reviewed condition went ahead today and she can slowly begin wearing normal shoe gear and I did dispense ankle compression stocking  X-rays indicate osteotomies healing well fixation in place no signs of pathology

## 2022-02-22 DIAGNOSIS — Z6825 Body mass index (BMI) 25.0-25.9, adult: Secondary | ICD-10-CM | POA: Diagnosis not present

## 2022-02-22 DIAGNOSIS — R350 Frequency of micturition: Secondary | ICD-10-CM | POA: Diagnosis not present

## 2022-04-22 ENCOUNTER — Encounter: Payer: Self-pay | Admitting: Emergency Medicine

## 2022-04-22 ENCOUNTER — Other Ambulatory Visit: Payer: Self-pay

## 2022-04-22 ENCOUNTER — Emergency Department
Admission: EM | Admit: 2022-04-22 | Discharge: 2022-04-22 | Disposition: A | Discharge status: Home / Self Care | Payer: BC Managed Care – PPO | Attending: Emergency Medicine | Admitting: Emergency Medicine

## 2022-04-22 DIAGNOSIS — S61011A Laceration without foreign body of right thumb without damage to nail, initial encounter: Secondary | ICD-10-CM | POA: Diagnosis not present

## 2022-04-22 DIAGNOSIS — W260XXA Contact with knife, initial encounter: Secondary | ICD-10-CM | POA: Insufficient documentation

## 2022-04-22 MED ORDER — CEPHALEXIN 500 MG PO CAPS: 1000.0000 mg | ORAL_CAPSULE | Freq: Two times a day (BID) | ORAL | 0 refills | Status: AC

## 2022-04-22 NOTE — ED Provider Notes (Signed)
[facility] Provider Note    Event Date/Time   First MD Initiated Contact with Patient 04/22/22 1820     (approximate)   History   Chief Complaint Laceration   HPI Madison [name] is a 62 y.o. female, no significant medical history, presents to the emergency department for evaluation of laceration to the right thumb.  She states that she was cutting food when she accidentally cut her right thumb with a knife.  Bleeding was controlled direct pressure.  She is up-to-date on her tetanus.  Denies fever/chills, cold sensation, paresthesias, or other injuries.  History Limitations: No limitations.        Physical Exam  Triage Vital Signs: ED Triage Vitals  Enc Vitals Group     BP --      Pulse --      Resp --      Temp --      Temp src --      SpO2 --      Weight 04/22/22 1808 170 lb (77.1 kg)     Height 04/22/22 1808 '5\' 9"'$  (1.753 m)     Head Circumference --      Peak Flow --      Pain Score 04/22/22 1807 0     Pain Loc --      Pain Edu? --      Excl. in [place]? --     Most recent vital signs: There were no vitals filed for this visit.  General: Awake, NAD.  Skin: Warm, dry. No rashes or lesions.  Eyes: PERRL. Conjunctivae normal.  CV: Good peripheral perfusion.  Resp: Normal effort.  Abd: Soft, non-tender. No distention.  Neuro: At baseline. No gross neurological deficits.  Musculoskeletal: Normal ROM of all extremities.  Focused Exam: 2 cm, L-shaped superficial laceration along the lateral aspect of the right thumb.  Wound edges approximated very well.  No active bleeding or discharge.  No foreign bodies.  No underlying osseous tenderness.  No surrounding warmth or erythema.  Normal range of motion of the thumb, including flexion and extension at the DIP and PIP joint.  No injuries to the wrist or forearm.  No involvement of the nail plate/nailbed.  Physical Exam    ED Results / Procedures / Treatments  Labs (all labs ordered are listed,  but only abnormal results are displayed) Labs Reviewed - No data to display   EKG N/A.    RADIOLOGY  ED Provider Interpretation: N/A.  No results found.  PROCEDURES:  Critical Care performed: N/A.  Marland Kitchen.Laceration Repair  Date/Time: 04/22/2022 6:49 PM  Performed by: Teodoro Spray, PA Authorized by: Teodoro Spray, PA   Consent:    Consent obtained:  Verbal   Consent given by:  Patient   Risks, benefits, and alternatives were discussed: yes     Risks discussed:  Infection, pain, need for additional repair, nerve damage, poor wound healing, poor cosmetic result, retained foreign body, tendon damage and vascular damage   Alternatives discussed:  No treatment Universal protocol:    Patient identity confirmed:  Verbally with patient Laceration details:    Location:  Finger   Finger location:  R thumb   Length (cm):  2   Depth (mm):  1 Pre-procedure details:    Preparation:  Patient was prepped and draped in usual sterile fashion Exploration:    Hemostasis achieved with:  Direct pressure   Wound extent: fascia not violated, no foreign body, no signs of injury,  no nerve damage, no tendon damage, no underlying fracture and no vascular damage   Treatment:    Area cleansed with:  Soap and water   Amount of cleaning:  Standard   Irrigation solution:  Tap water   Irrigation volume:  2000 ml   Irrigation method:  Pressure wash   Debridement:  None   Undermining:  None Skin repair:    Repair method:  Tissue adhesive Approximation:    Approximation:  Close Repair type:    Repair type:  Simple Post-procedure details:    Dressing:  Adhesive bandage   Procedure completion:  Tolerated well, no immediate complications     MEDICATIONS ORDERED IN ED: Medications - No data to display   IMPRESSION / MDM / [place] / ED COURSE  I reviewed the triage vital signs and the nursing notes.                              Differential diagnosis includes, but  is not limited to, laceration, foreign body, phalangeal fracture,    Assessment/Plan Patient presents with 2 cm, L-shaped superficial laceration after cutting it on a knife accidentally.  No active bleeding or discharge.  Wound edges appear to approximate well.  No foreign bodies.  No signs suggestive of underlying osseous injuries.  Rinsed thoroughly with tap water and soap.  I was able to repair it utilizing Dermabond glue.  She tolerated the procedure well with no immediate complications.  See above for details.  Provide her with a short course of cephalexin for infection prevention.  Will discharge.  Provided the patient with anticipatory guidance, return precautions, and educational material. Encouraged the patient to return to the emergency department at any time if they begin to experience any new or worsening symptoms. Patient expressed understanding and agreed with the plan.   Patient's presentation is most consistent with acute, uncomplicated illness.       FINAL CLINICAL IMPRESSION(S) / ED DIAGNOSES   Final diagnoses:  Laceration of right thumb without foreign body without damage to nail, initial encounter     Rx / DC Orders   ED Discharge Orders          Ordered    cephALEXin (KEFLEX) 500 MG capsule  2 times daily        04/22/22 1844             Note:  This document was prepared using Dragon voice recognition software and may include unintentional dictation errors.   Teodoro Spray, [place] 04/22/22 1850    Lucillie Garfinkel, MD 04/22/22 [phone]

## 2022-04-22 NOTE — ED Notes (Signed)
Pt states she sliced her right thumb on a mandolin slicer, pt with thumb wrapped in guaze, no drainage noted at this time. Pt has full movement and sensation.

## 2022-04-22 NOTE — Discharge Instructions (Addendum)
-  The Dermabond glue will dissolve on its own in 5 to 7 days.  No further interventions are needed.  It is waterproof, however do not apply any ointments, lotions, creams, or oils to the Dermabond this will cause it to dissolve prematurely.  You may keep it covered with a Band-Aid.  -Please take the full course of the cephalexin as prescribed.  This will help prevent infection.  -Return to the emergency department anytime if you begin to experience any new or worsening symptoms.

## 2022-04-22 NOTE — ED Triage Notes (Signed)
Pt via POV from home. Pt has a laceration to the R thumb states that she cut it on a mandolin. Denies blood thinner. Last tetanus was less than 5 years ago. Pt is A&Ox4 and NAD

## 2022-04-25 ENCOUNTER — Telehealth: Payer: Self-pay | Admitting: *Deleted

## 2022-04-25 ENCOUNTER — Encounter: Payer: Self-pay | Admitting: *Deleted

## 2022-04-25 NOTE — Patient Outreach (Signed)
  Care Coordination [facility] Note Transition Care Management Unsuccessful Follow-up Telephone Call  Date of discharge and from where:  ED Visit 04/22/22; [facility] simple (R) thumb laceration with dermabond repair  Attempts:  1st Attempt  Reason for unsuccessful TCM follow-up call:  Left voice message  Oneta Rack, RN, BSN, CCRN Alumnus RN CM Care Coordination/ Transition of [place] Management [phone]: direct office

## 2022-04-26 ENCOUNTER — Encounter: Payer: Self-pay | Admitting: *Deleted

## 2022-04-26 ENCOUNTER — Telehealth: Payer: Self-pay | Admitting: *Deleted

## 2022-04-26 NOTE — Patient Outreach (Signed)
Care Coordination TOC Note Transition Care Management Follow-up Telephone Call Date of discharge and from where: Sunday 04/22/22 ED Visit, [place] (R) thumb lac with dermabond repair; ED EMMI Red Alert, "No scheduled follow up" How have you been since you were released from the hospital? "I am doing fine; the wound is healing great and I have not picked up the antibiotic yet; I am considering it, but am watching the wound.  I am having absolutely no problems with the thumb since the ER visit and they did not recommend any follow up with my doctor, but I will call her if anything changes" Any questions or concerns? No  Items Reviewed: Did the pt receive and understand the discharge instructions provided? Yes  thoroughly reviewed with patient today Medications obtained and verified? Yes  full medication review completed as below in interventions Other? No  Any new allergies since your discharge? No  Dietary orders reviewed? No- not indicated Do you have support at home? Yes  reports fully independent in self-care activities  Home Care and Equipment/Supplies: Were home health services ordered? not applicable If so, what is the name of the agency? N/A  Has the agency set up a time to come to the patient's home? not applicable Were any new equipment or medical supplies ordered?  No What is the name of the medical supply agency? N/A Were you able to get the supplies/equipment? not applicable Do you have any questions related to the use of the equipment or supplies? No N/A  Functional Questionnaire: (I = Independent and D = Dependent) ADLs: I  Bathing/Dressing- I  Meal Prep- I  Eating- I  Maintaining continence- I  Transferring/Ambulation- I  Managing Meds- I  Follow up appointments reviewed:  PCP Hospital f/u appt confirmed? No  Scheduled to see - on - @ - verified no provider follow up recommended/ indicated post-ER visit Specialist Hospital f/u appt confirmed? No  Scheduled to see -  on - @ - Are transportation arrangements needed? No  If their condition worsens, is the pt aware to call PCP or go to the Emergency Dept.? Yes Was the patient provided with contact information for the PCP's office or ED? No- declined; reports already has contact information for all care providers Was to pt encouraged to call back with questions or concerns? Yes  SDOH assessments and interventions completed:   Yes SDOH Interventions Today    Flowsheet Row Most Recent Value  SDOH Interventions   Food Insecurity Interventions Intervention Not Indicated  Transportation Interventions Intervention Not Indicated  [drives self]      Interventions Today    Flowsheet Row Most Recent Value  General Interventions   General Interventions Discussed/Reviewed General Interventions Discussed  Education Interventions   Education Provided Provided Verbal Education  [signs/ symptoms wound infection along with corresponding action plan,  benmefit of taking probiotics if she decides to have the antibiotic prescription filled/ starts taking]  Pharmacy Interventions   Pharmacy Dicussed/Reviewed Pharmacy Topics Discussed  [full medication review completed,  patient self-manages medications and denies questions/ concerns around medications,  no discrepacies or concerns identified,  patient considering picking up antibiotic Rx- monitoring wound]      Care Coordination Interventions:  Full medication review; provided education around signs/ symptoms wound infection along with action plan for same; value of taking probiotics if she decides to have antibiotic Rx filled    Encounter Outcome:  Pt. Visit Completed    Oneta Rack, RN, BSN, CCRN Alumnus RN CM Care Coordination/  Transition of Care- [place] Management [phone]: direct office

## 2022-05-22 DIAGNOSIS — G5761 Lesion of plantar nerve, right lower limb: Secondary | ICD-10-CM | POA: Diagnosis not present

## 2022-05-22 DIAGNOSIS — M79671 Pain in right foot: Secondary | ICD-10-CM | POA: Diagnosis not present

## 2022-06-02 DIAGNOSIS — M79671 Pain in right foot: Secondary | ICD-10-CM | POA: Diagnosis not present

## 2022-06-05 ENCOUNTER — Encounter: Payer: Self-pay | Admitting: Internal Medicine

## 2022-06-05 ENCOUNTER — Ambulatory Visit (INDEPENDENT_AMBULATORY_CARE_PROVIDER_SITE_OTHER): Payer: BC Managed Care – PPO | Admitting: Internal Medicine

## 2022-06-05 VITALS — BP 118/64 | HR 67 | Temp 98.2°F | Ht 69.0 in | Wt 177.0 lb

## 2022-06-05 DIAGNOSIS — E559 Vitamin D deficiency, unspecified: Secondary | ICD-10-CM | POA: Diagnosis not present

## 2022-06-05 DIAGNOSIS — Z1211 Encounter for screening for malignant neoplasm of colon: Secondary | ICD-10-CM

## 2022-06-05 DIAGNOSIS — C73 Malignant neoplasm of thyroid gland: Secondary | ICD-10-CM

## 2022-06-05 DIAGNOSIS — Z136 Encounter for screening for cardiovascular disorders: Secondary | ICD-10-CM

## 2022-06-05 DIAGNOSIS — E890 Postprocedural hypothyroidism: Secondary | ICD-10-CM | POA: Diagnosis not present

## 2022-06-05 DIAGNOSIS — Z0000 Encounter for general adult medical examination without abnormal findings: Secondary | ICD-10-CM | POA: Diagnosis not present

## 2022-06-05 LAB — TSH: TSH: 1.72 u[IU]/mL (ref 0.35–5.50)

## 2022-06-05 LAB — COMPREHENSIVE METABOLIC PANEL
ALT: 20 U/L (ref 0–35)
AST: 26 U/L (ref 0–37)
Albumin: 4 g/dL (ref 3.5–5.2)
Alkaline Phosphatase: 67 U/L (ref 39–117)
BUN: 21 mg/dL (ref 6–23)
CO2: 27 mEq/L (ref 19–32)
Calcium: 9.4 mg/dL (ref 8.4–10.5)
Chloride: 105 mEq/L (ref 96–112)
Creatinine, Ser: 0.82 mg/dL (ref 0.40–1.20)
GFR: 76.06 mL/min (ref >60.00)
Glucose, Bld: 78 mg/dL (ref 70–99)
Potassium: 4.1 mEq/L (ref 3.5–5.1)
Sodium: 140 mEq/L (ref 135–145)
Total Bilirubin: 0.7 mg/dL (ref 0.2–1.2)
Total Protein: 6.7 g/dL (ref 6.0–8.3)

## 2022-06-05 LAB — LIPID PANEL
Cholesterol: 186 mg/dL (ref 0–200)
HDL: 70.1 mg/dL (ref >39.00)
LDL Cholesterol: 88 mg/dL (ref 0–99)
NonHDL: 115.45
Total CHOL/HDL Ratio: 3
Triglycerides: 136 mg/dL (ref 0.0–149.0)
VLDL: 27.2 mg/dL (ref 0.0–40.0)

## 2022-06-05 LAB — T4, FREE: Free T4: 0.96 ng/dL (ref 0.60–1.60)

## 2022-06-05 LAB — CBC
HCT: 43.1 % (ref 36.0–46.0)
Hemoglobin: 14.9 g/dL (ref 12.0–15.0)
MCHC: 34.6 g/dL (ref 30.0–36.0)
MCV: 95.4 fl (ref 78.0–100.0)
Platelets: 295 10*3/uL (ref 150.0–400.0)
RBC: 4.52 Mil/uL (ref 3.87–5.11)
RDW: 12.9 % (ref 11.5–15.5)
WBC: 5.9 10*3/uL (ref 4.0–10.5)

## 2022-06-05 LAB — VITAMIN D 25 HYDROXY (VIT D DEFICIENCY, FRACTURES): VITD: 21.45 ng/mL — ABNORMAL LOW (ref 30.00–100.00)

## 2022-06-05 NOTE — Progress Notes (Signed)
   Subjective:   Patient ID: Madison [name], female    DOB: [birth year], 62 y.o.   MRN: [id number]  HPI The patient is here for physical.  PMH, [facility], social history reviewed and updated  Review of Systems  Constitutional: Negative.   HENT: Negative.    Eyes: Negative.   Respiratory:  Negative for cough, chest tightness and shortness of breath.   Cardiovascular:  Negative for chest pain, palpitations and leg swelling.  Gastrointestinal:  Negative for abdominal distention, abdominal pain, constipation, diarrhea, nausea and vomiting.  Musculoskeletal: Negative.   Skin: Negative.   Neurological: Negative.   Psychiatric/Behavioral: Negative.      Objective:  Physical Exam Constitutional:      Appearance: She is well-developed.  HENT:     Head: Normocephalic and atraumatic.  Cardiovascular:     Rate and Rhythm: Normal rate and regular rhythm.  Pulmonary:     Effort: Pulmonary effort is normal. No respiratory distress.     Breath sounds: Normal breath sounds. No wheezing or rales.  Abdominal:     General: Bowel sounds are normal. There is no distension.     Palpations: Abdomen is soft.     Tenderness: There is no abdominal tenderness. There is no rebound.  Musculoskeletal:     Cervical back: Normal range of motion.  Skin:    General: Skin is warm and dry.  Neurological:     Mental Status: She is alert and oriented to person, place, and time.     Coordination: Coordination normal.     Vitals:   06/05/22 0803  BP: 118/64  Pulse: 67  Temp: 98.2 F (36.8 C)  TempSrc: Oral  SpO2: 97%  Weight: 177 lb (80.3 kg)  Height: 5\' 9"  (1.753 m)    Assessment & Plan:

## 2022-06-05 NOTE — Assessment & Plan Note (Addendum)
Flu shot up to date. Shingrix complete. Tetanus up to date. Colonoscopy due referral placed. Mammogram up to date, pap smear up to date and dexa up to date. Counseled about sun safety and mole surveillance. Counseled about the dangers of distracted driving. Given 10 year screening recommendations.

## 2022-06-05 NOTE — Assessment & Plan Note (Signed)
Checking TSH and free T4 and adjust levothyroxine 100 mcg daily as needed.

## 2022-06-05 NOTE — Assessment & Plan Note (Signed)
Checking thyroglobulin level and TSH/free T4. Adjust levothyroxine 100 mcg daily as needed.

## 2022-06-05 NOTE — Assessment & Plan Note (Signed)
Checking vitamin D and adjust as needed.  

## 2022-06-06 LAB — THYROGLOBULIN BY IMA: Thyroglobulin by IMA: <0.1 ng/mL — ABNORMAL LOW (ref 1.5–38.5)

## 2022-06-06 LAB — TGAB+THYROGLOBULIN IMA OR LCMS: Thyroglobulin Antibody: <1 IU/mL (ref 0.0–0.9)

## 2022-06-08 DIAGNOSIS — G5761 Lesion of plantar nerve, right lower limb: Secondary | ICD-10-CM | POA: Diagnosis not present

## 2022-06-25 ENCOUNTER — Encounter: Payer: Self-pay | Admitting: Internal Medicine

## 2022-06-25 ENCOUNTER — Ambulatory Visit (INDEPENDENT_AMBULATORY_CARE_PROVIDER_SITE_OTHER): Payer: BC Managed Care – PPO | Admitting: Internal Medicine

## 2022-06-25 VITALS — BP 120/76 | HR 73 | Ht 69.0 in | Wt 175.8 lb

## 2022-06-25 DIAGNOSIS — E890 Postprocedural hypothyroidism: Secondary | ICD-10-CM

## 2022-06-25 DIAGNOSIS — C73 Malignant neoplasm of thyroid gland: Secondary | ICD-10-CM

## 2022-06-25 MED ORDER — LEVOTHYROXINE SODIUM 100 MCG PO TABS: 100.0000 ug | ORAL_TABLET | Freq: Every day | ORAL | 3 refills | Status: DC

## 2022-06-25 NOTE — Patient Instructions (Signed)
Please continue Levothyroxine 100 mcg daily.  Take the thyroid hormone every day, with water, at least 30 minutes before breakfast, separated by at least 4 hours from: - acid reflux medications - calcium - iron - multivitamins  Please come back for a follow-up appointment in 1 year.  

## 2022-06-25 NOTE — Progress Notes (Signed)
Patient ID: Madison [name], female   DOB: April 18, 1960, 62 y.o.   MRN: [id number]  HPI  Madison [name] is a 62 y.o.-year-old female, initially referred by Dr. 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  She is was seeing Dr. Tedd Sias before.  Last visit with me 1 year ago.  Interval history: She has joint pains (knees) for which she puts collagen in her almond milk in am. No neck compression symptoms. No other complaints at today's visit other than hot flushes - started 1-2 mo ago. She had a steroid inj for Morton's Neuroma 1 mo ago.  Reviewed and addended history: She felt a nodule in her neck 03/2017.  She presented to see her PCP who ordered a thyroid ultrasound.  A large thyroid nodule was seen in the left neck.  This was biopsied and the lesion was inconclusive.  She was then sent for left hemithyroidectomy which showed Hurthle cell cancer.  Reviewed patient's chart and Dr. Pricilla Handler notes: 09/10/2017: FNA of the left thyroid nodule: Cystic components (scant cellular material), inconclusive 12/02/2017: Left hemithyroidectomy-Dr. Gerrit Friends. Pathology: 3.5 x 2.6 x 2.2 cm Hurthle cell carcinoma with negative margins and no lymphatic invasion but with angioinvasion.  No lymph nodes were submitted then.  Staging: pT2Nx 12/11/2017: Total thyroidectomy-Dr. Gerrit Friends Pathology: Right lobe benign 01/06/2018: RAI treatment with 75 mCi Post ablative WBS: 2 foci of uptake in the thyroid bed, no distant uptake 05/29/2018: Neck ultrasound: Normal changes after thyroidectomy without significant anterior neck adenopathy 02/16/2019: Whole-body scan: interval resolution of activity in the thyroid bed, no metastases  Her thyroglobulin levels and antibodies have been undetectable: Lab Results  Component Value Date   THYROGLB <0.1 (L) 06/05/2022   THYROGLB <0.1 (L) 06/02/2021   THYROGLB <0.1 (L) 06/22/2020   THYROGLB <0.1 (L) 06/23/2019   THYROGLB <2.0 12/23/2018    Lab Results  Component Value Date   THGAB <1.0 06/05/2022   THGAB <1.0 06/02/2021   THGAB <1.0 06/22/2020   THGAB <1.0 06/23/2019   THGAB <1.0 12/23/2018  05/28/2018: Tg <0.1, ATA <1  02/18/2018: Tg <0.1, ATA <1  Madison [name] denies: - feeling nodules in neck - hoarseness - dysphagia - choking  Postsurgical hypothyroidism  Madison [name] is on levothyroxine 100 mcg daily, taken: - in am (5:30 am) - fasting - at least 30 min from b'fast - no Ca, Fe,  PPIs, + multivitamins at night or lunchtime - infrequently - + on Biotin - off and on - + collagen powder in almond milk later in am  Reviewed her TFTs: Lab Results  Component Value Date   TSH 1.72 06/05/2022   TSH 1.02 06/02/2021   TSH 1.23 06/22/2020   TSH 0.74 06/23/2019   TSH 1.74 12/23/2018   TSH 2.03 07/18/2017   FREET4 0.96 06/05/2022   FREET4 0.98 06/02/2021   FREET4 1.13 06/22/2020   FREET4 1.05 06/23/2019   FREET4 1.07 12/23/2018   FREET4 0.83 07/18/2017  09/15/2018: TSH 1.738 05/28/2018: TSH 0.907 02/18/2018: TSH 0.352    She previously had some palpitations, which resolved.  No family history of thyroid disorders or thyroid cancer. No history of radiation therapy to head or neck.  She had radiotherapy as a child after an infected vaccine wound.   ROS: + see HPI  I reviewed Madison [name]'s medications, allergies, PMH, social hx, family hx, and changes were documented in the history of present illness. Otherwise, unchanged from my initial visit note.  Past Medical History:  Diagnosis Date  Chicken pox    Fibroid    PONV (postoperative nausea and vomiting)    Thyroid neoplasm    Past Surgical History:  Procedure Laterality Date   ABLATION     AUGMENTATION MAMMAPLASTY     BREAST ENHANCEMENT SURGERY     2010   BREAST SURGERY     breast bx 2015 benign breasts cysts    FOOT SURGERY     left removed partial nerve    THYROID LOBECTOMY Left 12/02/2017   Procedure: LEFT THYROID LOBECTOMY;  Surgeon: Darnell Level, MD;  Location: [facility]  ORS;  Service: General;  Laterality: Left;   THYROIDECTOMY N/A 12/10/2017   Procedure: COMPLETION THYROIDECTOMY;  Surgeon: Darnell Level, MD;  Location: [facility] ORS;  Service: General;  Laterality: N/A;   TONSILLECTOMY     TOTAL VAGINAL HYSTERECTOMY     fibroids    Social History   Socioeconomic History   Marital status: Divorced    Spouse name: Not on file   Number of children: Not on file   Years of education: Not on file   Highest education level: Not on file  Occupational History   Not on file  Tobacco Use   Smoking status: Former    Types: Cigarettes    Quit date: 44    Years since quitting: 36.2   Smokeless tobacco: Never   Tobacco comments:    smoked 13 years ago 1/2 ppd   Vaping Use   Vaping Use: Never used  Substance and Sexual Activity   Alcohol use: Yes    Comment: occasionally    Drug use: Never   Sexual activity: Not Currently    Birth control/protection: Surgical    Comment: Hysterectomy 2008   Other Topics Concern   Not on file  Social History Narrative   Safe in relationship    Owns guns    Wears seat Biochemist, clinical    2 kids    Social Determinants of Health   Financial Resource Strain: Not on file  Food Insecurity: No Food Insecurity (04/26/2022)   Hunger Vital Sign    Worried About Running Out of Food in the Last Year: Never true    Ran Out of Food in the Last Year: Never true  Transportation Needs: No Transportation Needs (04/26/2022)   PRAPARE - Administrator, Civil Service (Medical): No    Lack of Transportation (Non-Medical): No  Physical Activity: Not on file  Stress: Not on file  Social Connections: Not on file  Intimate Partner Violence: Not on file   Current Outpatient Medications on File Prior to Visit  Medication Sig Dispense Refill   levothyroxine (SYNTHROID) 100 MCG tablet Take 1 tablet (100 mcg total) by mouth daily. 90 tablet 3   Multiple Vitamin (MULTIVITAMIN) tablet Take 1 tablet by mouth daily.      TART CHERRY PO Take by mouth.     Turmeric (QC TUMERIC COMPLEX PO) Take by mouth.     No current facility-administered medications on file prior to visit.   Allergies  Allergen Reactions   Adhesive [Tape]     Had moles removes on back and when they put bandage over it , it caused bumpiness underneath the bandage    Family History  Problem Relation Age of Onset   Diabetes Mother    Heart disease Mother    Depression Mother    Hip fracture Mother    Alcohol abuse Father    Cancer  Father        mesthelioma house with asbestos    Skin cancer Sister    Skin cancer Brother    Bladder Cancer Maternal Aunt    Breast cancer Paternal Aunt    PE: BP 120/76 (BP Location: Right Arm, Patient Position: Sitting, Cuff Size: Normal)   Pulse 73   Ht 5\' 9"  (1.753 m)   Wt 175 lb 12.8 oz (79.7 kg)   SpO2 99%   BMI 25.96 kg/m  Wt Readings from Last 3 Encounters:  06/25/22 175 lb 12.8 oz (79.7 kg)  06/05/22 177 lb (80.3 kg)  04/22/22 170 lb (77.1 kg)   Constitutional: Slightly overweight, in NAD Eyes: EOMI, no exophthalmos ENT no neck masses palpated, + thyroidectomy scar healed, no cervical lymphadenopathy Cardiovascular: RRR, No MRG Respiratory: CTA B Musculoskeletal: no deformities Skin: no rashes Neurological: no tremor with outstretched hands  ASSESSMENT: 1. Thyroid cancer (Hurthle cell) - see HPI  2. Postsurgical Hypothyroidism  PLAN:  1.  Hurthle cell thyroid cancer - Patient with history of thyroid Hurthle cell cancer without lymphatic invasion but with angioinvasion.  Because of this, she has a slightly higher risk of metastasis.  Also, both of thyroid cancer is a more unusual type of cancer, which may be less responsive to RAI treatment and this will be affected by thyroglobulin levels.  However, in general, thyroid cancer is a slow-growing cancer, with good prognosis.  At the end of 2020, due to the slightly higher risk compared to the classic papillary thyroid cancer, we  checked a whole-body scan and this was negative for metastasis or recurrences.  Also, there was no increased activity in the thyroid bed.  Therefore, for now, we can follow her with neck ultrasounds on an as-needed basis.  Plan to repeat an ultrasound in 1 to 3 years. -No neck masses palpated on today's exam and no neck compression symptoms -She just had thyroglobulin and thyroglobulin antibodies checked by PCP on 06/05/2022 and they were at goal, with undetectable thyroglobulin and an excellent TSH - I will see the patient back in 1 year  2. Patient with history of total thyroidectomy for thyroid cancer, now with iatrogenic hypothyroidism, on levothyroxine therapy - latest thyroid labs reviewed with Madison [name]. >> normal: Lab Results  Component Value Date   TSH 1.72 06/05/2022  - she continues on LT4 100 mcg daily - Madison [name] feels good on this dose but in the last 1 to 2 months she started to have hot flashes.  She had a hysterectomy at 62 years old and she had some hot flashes, but not bothersome at that time. We discussed that sometimes the menopausal hot flashes can recur in a fluctuating fashion.  They do not appear to be related to the thyroid since her TFTs are normal. - we discussed about taking the thyroid hormone every day, with water, >30 minutes before breakfast, separated by >4 hours from acid reflux medications, calcium, iron, multivitamins. Madison [name]. is taking it correctly. - I refilled her levothyroxine dose for a year - RTC in 1 year  Carlus Pavlovristina Jammy Stlouis, MD PhD [facility]

## 2022-08-06 ENCOUNTER — Encounter: Payer: Self-pay | Admitting: Internal Medicine

## 2022-08-07 DIAGNOSIS — Z713 Dietary counseling and surveillance: Secondary | ICD-10-CM | POA: Diagnosis not present

## 2022-08-10 DIAGNOSIS — Z713 Dietary counseling and surveillance: Secondary | ICD-10-CM | POA: Diagnosis not present

## 2022-08-10 DIAGNOSIS — Z6826 Body mass index (BMI) 26.0-26.9, adult: Secondary | ICD-10-CM | POA: Diagnosis not present

## 2022-08-31 DIAGNOSIS — Z6826 Body mass index (BMI) 26.0-26.9, adult: Secondary | ICD-10-CM | POA: Diagnosis not present

## 2022-08-31 DIAGNOSIS — Z713 Dietary counseling and surveillance: Secondary | ICD-10-CM | POA: Diagnosis not present

## 2022-09-20 ENCOUNTER — Encounter: Payer: Self-pay | Admitting: Emergency Medicine

## 2022-09-20 ENCOUNTER — Other Ambulatory Visit: Payer: Self-pay

## 2022-09-20 ENCOUNTER — Emergency Department: Payer: BC Managed Care – PPO

## 2022-09-20 ENCOUNTER — Emergency Department
Admission: EM | Admit: 2022-09-20 | Discharge: 2022-09-20 | Disposition: A | Discharge status: Home / Self Care | Payer: BC Managed Care – PPO | Attending: Emergency Medicine | Admitting: Emergency Medicine

## 2022-09-20 DIAGNOSIS — E039 Hypothyroidism, unspecified: Secondary | ICD-10-CM | POA: Diagnosis not present

## 2022-09-20 DIAGNOSIS — R0789 Other chest pain: Secondary | ICD-10-CM | POA: Diagnosis not present

## 2022-09-20 DIAGNOSIS — Z85850 Personal history of malignant neoplasm of thyroid: Secondary | ICD-10-CM | POA: Diagnosis not present

## 2022-09-20 DIAGNOSIS — R079 Chest pain, unspecified: Secondary | ICD-10-CM | POA: Diagnosis not present

## 2022-09-20 LAB — CBC
HCT: 41.9 % (ref 36.0–46.0)
Hemoglobin: 14.6 g/dL (ref 12.0–15.0)
MCH: 33 pg (ref 26.0–34.0)
MCHC: 34.8 g/dL (ref 30.0–36.0)
MCV: 94.8 fL (ref 80.0–100.0)
Platelets: 282 10*3/uL (ref 150–400)
RBC: 4.42 MIL/uL (ref 3.87–5.11)
RDW: 12 % (ref 11.5–15.5)
WBC: 7 10*3/uL (ref 4.0–10.5)
nRBC: 0 % (ref 0.0–0.2)

## 2022-09-20 LAB — BASIC METABOLIC PANEL
Anion gap: 9 (ref 5–15)
BUN: 21 mg/dL (ref 8–23)
CO2: 23 mmol/L (ref 22–32)
Calcium: 9.3 mg/dL (ref 8.9–10.3)
Chloride: 106 mmol/L (ref 98–111)
Creatinine, Ser: 0.8 mg/dL (ref 0.44–1.00)
GFR, Estimated: >60 mL/min (ref >60)
Glucose, Bld: 96 mg/dL (ref 70–99)
Potassium: 3.7 mmol/L (ref 3.5–5.1)
Sodium: 138 mmol/L (ref 135–145)

## 2022-09-20 LAB — TROPONIN I (HIGH SENSITIVITY)
Troponin I (High Sensitivity): <2 ng/L (ref <18)
Troponin I (High Sensitivity): <2 ng/L (ref <18)

## 2022-09-20 NOTE — Discharge Instructions (Addendum)
Return to the ER for new, worsening, or persistent severe chest pain, difficulty breathing, weakness or lightheadedness, or any other new or worsening symptoms that concern you.  Follow-up with your primary care provider. °

## 2022-09-20 NOTE — ED Notes (Signed)
See triage note. Patient was in [employer] shopping and was putting items in her shopping cart and sneezed and has pain that radiated from her chest in the middle that radiated in all directions towards her back. Patient denies SOB, any pain at this time or any other symptoms.

## 2022-09-20 NOTE — ED Provider Notes (Signed)
[facility] Provider Note    Event Date/Time   First MD Initiated Contact with Patient 09/20/22 1708     (approximate)   History   Chest Pain   HPI  Madison [name] is a 62 y.o. female with a history of thyroid cancer and postsurgical hypothyroidism who presents with chest pain.  The patient states that she went to a few stores today and walked into a [employer] when she had a small sneeze and then suddenly felt intense chest pain, described as squeezing and sharp, substernal in location, and not associated with any shortness of breath.  The patient did feel somewhat lightheaded.  She sat down and states that the pain resolved after approximately 15 minutes.  She is currently completely asymptomatic.  She denies any prior history of similar episodes.  The patient denies any nausea or vomiting.  She has had no palpitations and no cough or fever.  She has no leg swelling.  She states she was not exerting herself and was not out in the heat today.  I reviewed the past medical records.  The patient's most recent outpatient counter was with Dr. Elvera Lennox from endocrinology for follow-up of her hypothyroidism and thyroid cancer.   Physical Exam   Triage Vital Signs: ED Triage Vitals [09/20/22 1519]  Enc Vitals Group     BP (!) 162/78     Pulse Rate 80     Resp 18     Temp 98.8 F (37.1 C)     Temp Source Oral     SpO2 98 %     Weight 170 lb (77.1 kg)     Height 5\' 9"  (1.753 m)     Head Circumference      Peak Flow      Pain Score 3     Pain Loc      Pain Edu?      Excl. in GC?     Most recent vital signs: Vitals:   09/20/22 1800 09/20/22 1854  BP: (!) 159/83 (!) 155/73  Pulse: 61 71  Resp: 14 16  Temp:  98.3 F (36.8 C)  SpO2: 100% 100%     General: Alert, well-appearing, no distress.  CV:  Good peripheral perfusion.  Normal heart sounds. Resp:  Normal effort.  Lungs CTAB. Abd:  No distention.  Other:  No peripheral edema.   ED Results /  Procedures / Treatments   Labs (all labs ordered are listed, but only abnormal results are displayed) Labs Reviewed  BASIC METABOLIC PANEL  CBC  TROPONIN I (HIGH SENSITIVITY)  TROPONIN I (HIGH SENSITIVITY)     EKG  ED ECG REPORT I, Dionne Bucy, the attending physician, personally viewed and interpreted this ECG.  Date: 09/20/2022 EKG Time: 1516 Rate: 77 Rhythm: normal sinus rhythm QRS Axis: normal Intervals: normal ST/T Wave abnormalities: None specific T wave abnormalities Narrative Interpretation: no evidence of acute ischemia    RADIOLOGY  Chest x-ray: I independently viewed and interpreted the images; there is no focal consolidation or edema   PROCEDURES:  Critical Care performed: No  Procedures   MEDICATIONS ORDERED IN ED: Medications - No data to display   IMPRESSION / MDM / ASSESSMENT AND PLAN / ED COURSE  I reviewed the triage vital signs and the nursing notes.  62 year old female with PMH as noted above presents with an episode of atypical, nonexertional chest pain occurring after a sneeze and now completely resolved.  The patient is currently asymptomatic.  On exam her vital signs are normal except for mild hypertension.  She is well-appearing.  Physical exam is otherwise unremarkable for acute findings.  EKG is nonischemic.  Differential diagnosis includes, but is not limited to, musculoskeletal chest wall pain, nerve pain, GERD, near syncope, other benign etiology.  I have a low suspicion for ACS.  The clinical presentation is not consistent with PE, aortic dissection, or other vascular cause given that it resolved on its own.  Initial troponin is negative.  BMP and CBC show no acute findings.  Chest x-ray is clear.  We will obtain a repeat troponin.  If this is negative I anticipate discharge home.  Patient's presentation is most consistent with acute complicated illness / injury requiring diagnostic workup.  The patient is on the cardiac  monitor to evaluate for evidence of arrhythmia and/or significant heart rate changes.   ----------------------------------------- 7:17 PM on 09/20/2022 -----------------------------------------  Repeat troponin is negative.  The patient has had no recurrence of chest pain.  She is stable for discharge home at this time.  I counseled her on the results of the workup.  I gave strict return precautions and she expressed understanding.  FINAL CLINICAL IMPRESSION(S) / ED DIAGNOSES   Final diagnoses:  Atypical chest pain     Rx / DC Orders   ED Discharge Orders     None        Note:  This document was prepared using Dragon voice recognition software and may include unintentional dictation errors.    Dionne Bucy, MD 09/20/22 2337

## 2022-09-20 NOTE — ED Triage Notes (Signed)
Patient to ED via P[facility] [place] [employer] for chest pain. Patient states she sneezed then had a sharp pain in the middle of her chest that radiated into her back. States pain is 3/10 currently and feels tight. Denies cardiac history.

## 2022-09-28 DIAGNOSIS — Z6826 Body mass index (BMI) 26.0-26.9, adult: Secondary | ICD-10-CM | POA: Diagnosis not present

## 2022-09-28 DIAGNOSIS — Z713 Dietary counseling and surveillance: Secondary | ICD-10-CM | POA: Diagnosis not present

## 2022-10-02 DIAGNOSIS — G5761 Lesion of plantar nerve, right lower limb: Secondary | ICD-10-CM | POA: Diagnosis not present

## 2022-10-17 ENCOUNTER — Ambulatory Visit: Payer: BC Managed Care – PPO | Admitting: Obstetrics and Gynecology

## 2022-10-17 ENCOUNTER — Encounter: Payer: Self-pay | Admitting: Nurse Practitioner

## 2022-10-17 ENCOUNTER — Ambulatory Visit (INDEPENDENT_AMBULATORY_CARE_PROVIDER_SITE_OTHER): Payer: BC Managed Care – PPO | Admitting: Nurse Practitioner

## 2022-10-17 VITALS — BP 110/70 | HR 86 | Ht 69.0 in | Wt 178.0 lb

## 2022-10-17 DIAGNOSIS — Z1211 Encounter for screening for malignant neoplasm of colon: Secondary | ICD-10-CM

## 2022-10-17 DIAGNOSIS — Z780 Asymptomatic menopausal state: Secondary | ICD-10-CM

## 2022-10-17 DIAGNOSIS — M85859 Other specified disorders of bone density and structure, unspecified thigh: Secondary | ICD-10-CM

## 2022-10-17 DIAGNOSIS — Z01419 Encounter for gynecological examination (general) (routine) without abnormal findings: Secondary | ICD-10-CM | POA: Diagnosis not present

## 2022-10-17 DIAGNOSIS — M85851 Other specified disorders of bone density and structure, right thigh: Secondary | ICD-10-CM

## 2022-10-17 NOTE — Progress Notes (Signed)
Madison [name] [birth year] [id number]   History:  62 y.o. G2P2002 presents for annual exam. Postmenopausal - no HRT. S/P TVH at age 36 for fibroids. H/O thyroid cancer - managed with thyroidectomy and radioactive iodine. Has been struggling with weight. Less active due to desk job and pain from morton's neuroma. Having 3rd surgery on right foot next month.   Gynecologic History No LMP recorded. Patient has had a hysterectomy.   Contraception/Family planning: status post hysterectomy Sexually active: No  Health Maintenance Last Pap: 2018. Results were: Normal Last mammogram: 10/26/2021. Results were: Normal Last colonoscopy: Never. Negative Cologuard 10/02/2018 Last Dexa: 01/10/2022. Results were: T-score -1.6, FRAX 17% / 0.9%  Past medical history, past surgical history, family history and social history were all reviewed and documented in the EPIC chart. Divorced.  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 Daughter in [place] - has 3 kids. Daughter in [place] - has 2 kids, just become a Engineer, civil (consulting).  ROS:  A ROS was performed and pertinent positives and negatives are included.  Exam:  Vitals:   10/17/22 0756  BP: 110/70  Pulse: 86  SpO2: 100%  Weight: 178 lb (80.7 kg)  Height: 5\' 9"  (1.753 m)   Body mass index is 26.29 kg/m.  General appearance:  Normal Thyroid:  Symmetrical, normal in size, without palpable masses or nodularity. Respiratory  Auscultation:  Clear without wheezing or rhonchi Cardiovascular  Auscultation:  Regular rate, without rubs, murmurs or gallops  Edema/varicosities:  Not grossly evident Abdominal  Soft,nontender, without masses, guarding or rebound.  Liver/spleen:  No organomegaly noted  Hernia:  None appreciated  Skin  Inspection:  Grossly normal Breasts: Examined lying and sitting. Bilateral implants noted.   Right: Without masses, retractions, nipple discharge or axillary adenopathy.   Left: Without masses, retractions, nipple discharge or axillary  adenopathy. Genitourinary   Inguinal/mons:  Normal without inguinal adenopathy  External genitalia:  Normal appearing vulva with no masses, tenderness, or lesions  BUS/Urethra/Skene's glands:  Normal  Vagina:  Normal appearing with normal color and discharge, no lesions. Atrophic changes  Cervix:  And uterus absent  Adnexa/parametria:     Rt: Normal in size, without masses or tenderness.   Lt: Normal in size, without masses or tenderness.  Anus and perineum: Normal  Digital rectal exam: Deferred  Patient informed chaperone available to be present for breast and pelvic exam. Patient has requested no chaperone to be present. Patient has been advised what will be completed during breast and pelvic exam.   Assessment/Plan:  62 y.o. G2P2002 for annual exam.   Well female exam with routine gynecological exam - Education provided on SBEs, importance of preventative screenings, current guidelines, high calcium diet, regular exercise, and multivitamin daily.  Labs with PCP and endo. Discussed importance of calorie deficit for weight loss and intermittent fasting.   Postmenopausal - no HRT. S/P TVH at age 61 for fibroids.   Osteopenia of necks of both femurs - T-score -1.7 without elevated FRAX 12/2021. Continue Vit D supplement, high calcium diet and increase exercise.   Screening for colon cancer - Plan: Cologuard. Did not tolerate prep for colonscopy in the past (ended up hospitalized). Negative Cologuard in 2020.  Screening for cervical cancer - Normal Pap history.  No longer screening per guidelines.   Screening for breast cancer - Normal mammogram history.  Continue annual screenings.  Normal breast exam today.  Return in 1 year for annual or sooner if needed.     Olivia Mackie DNP, 8:07 AM 10/17/2022

## 2022-10-19 DIAGNOSIS — Z713 Dietary counseling and surveillance: Secondary | ICD-10-CM | POA: Diagnosis not present

## 2022-10-19 DIAGNOSIS — Z6826 Body mass index (BMI) 26.0-26.9, adult: Secondary | ICD-10-CM | POA: Diagnosis not present

## 2022-10-29 DIAGNOSIS — Z1211 Encounter for screening for malignant neoplasm of colon: Secondary | ICD-10-CM | POA: Diagnosis not present

## 2022-10-31 ENCOUNTER — Other Ambulatory Visit: Payer: Self-pay

## 2022-10-31 ENCOUNTER — Encounter (HOSPITAL_BASED_OUTPATIENT_CLINIC_OR_DEPARTMENT_OTHER): Payer: Self-pay | Admitting: Orthopaedic Surgery

## 2022-11-05 NOTE — Discharge Instructions (Addendum)
Madison Cedars, MD [facility]  Please read the following information regarding your care after surgery.  Medications  You only need a prescription for the narcotic pain medicine (ex. oxycodone, Percocet, Norco).  All of the other medicines listed below are available over the counter. ? Aleve 2 pills twice a day for the first 3 days after surgery. ? acetominophen (Tylenol) 650 mg every 4-6 hours as you need for minor to moderate pain ? oxycodone as prescribed for severe pain  ? To help prevent blood clots, take aspirin (81 mg) twice daily for 28 days after surgery.  You should also get up every hour while you are awake to move around.  Weight Bearing ? DO NOT PUT ANY WEIGHT ON THE OPERATIVE LEG UNTIL YOUR NERVE BLOCK HAS COMPLETELY WORN OFF!!!  Heel weightbear to balance or pivot or transfer on the operated leg or foot. Please wear your postop shoe and use walker whenever walking. This means do NOT touch the front of your surgical leg to the ground!  Cast / Splint / Dressing ? If you have a dressing, ok to change this at 48 hours postop. Keep your splint, cast or dressing clean and dry.  Don't put anything (coat hanger, pencil, etc) down inside of it.  If it gets wet, call the office immediately to schedule an appointment for a cast change.  Swelling IMPORTANT: It is normal for you to have swelling where you had surgery. To reduce swelling and pain, keep at least 3 pillows under your leg so that your toes are above your nose and your heel is above the level of your hip.  It may be necessary to keep your foot or leg elevated for several weeks.  This is critical to helping your incisions heal and your pain to feel better.  Follow Up  [facility] 8:45 AM 11/13/22 TUESDAY  Call my office at [phone] when you are discharged from the hospital or surgery center to schedule an appointment to be seen 7-10 days after surgery.  Call my office at [phone] if you develop a  fever >101.5 F, nausea, vomiting, bleeding from the surgical site or severe pain.       Post Anesthesia Home Care Instructions  Activity: Get plenty of rest for the remainder of the day. A responsible individual must stay with you for 24 hours following the procedure.  For the next 24 hours, DO NOT: -Drive a car -Advertising copywriter -Drink alcoholic beverages -Take any medication unless instructed by your physician -Make any legal decisions or sign important papers.  Meals: Start with liquid foods such as gelatin or soup. Progress to regular foods as tolerated. Avoid greasy, spicy, heavy foods. If nausea and/or vomiting occur, drink only clear liquids until the nausea and/or vomiting subsides. Call your physician if vomiting continues.  Special Instructions/Symptoms: Your throat may feel dry or sore from the anesthesia or the breathing tube placed in your throat during surgery. If this causes discomfort, gargle with warm salt water. The discomfort should disappear within 24 hours.  If you had a scopolamine patch placed behind your ear for the management of post- operative nausea and/or vomiting:  1. The medication in the patch is effective for 72 hours, after which it should be removed.  Wrap patch in a tissue and discard in the trash. Wash hands thoroughly with soap and water. 2. You may remove the patch earlier than 72 hours if you experience unpleasant side effects which may include dry  mouth, dizziness or visual disturbances. 3. Avoid touching the patch. Wash your hands with soap and water after contact with the patch.   Regional Anesthesia Blocks  1. You may not be able to move or feel the "blocked" extremity after a regional anesthetic block. This may last may last from 3-48 hours after placement, but it will go away. The length of time depends on the medication injected and your individual response to the medication. As the nerves start to wake up, you may experience tingling as  the movement and feeling returns to your extremity. If the numbness and inability to move your extremity has not gone away after 48 hours, please call your surgeon.   2. The extremity that is blocked will need to be protected until the numbness is gone and the strength has returned. Because you cannot feel it, you will need to take extra care to avoid injury. Because it may be weak, you may have difficulty moving it or using it. You may not know what position it is in without looking at it while the block is in effect.  3. For blocks in the legs and feet, returning to weight bearing and walking needs to be done carefully. You will need to wait until the numbness is entirely gone and the strength has returned. You should be able to move your leg and foot normally before you try and bear weight or walk. You will need someone to be with you when you first try to ensure you do not fall and possibly risk injury.  4. Bruising and tenderness at the needle site are common side effects and will resolve in a few days.  5. Persistent numbness or new problems with movement should be communicated to the surgeon or the [facility] [phone] [facility] [phone]).

## 2022-11-05 NOTE — H&P (Signed)
ORTHOPAEDIC SURGERY H&P  Subjective:  The patient presents with right recurrent 2nd and 3rd Morton's neuromas.   Past Medical History:  Diagnosis Date   Chicken pox    Fibroid    PONV (postoperative nausea and vomiting)    Thyroid neoplasm     Past Surgical History:  Procedure Laterality Date   ABLATION     AUGMENTATION MAMMAPLASTY     BREAST ENHANCEMENT SURGERY     2010   BREAST SURGERY     breast bx 2015 benign breasts cysts    FOOT SURGERY     left removed partial nerve    THYROID LOBECTOMY Left 12/02/2017   Procedure: LEFT THYROID LOBECTOMY;  Surgeon: Darnell Level, MD;  Location: [facility];  Service: General;  Laterality: Left;   THYROIDECTOMY N/A 12/10/2017   Procedure: COMPLETION THYROIDECTOMY;  Surgeon: Darnell Level, MD;  Location: [facility];  Service: General;  Laterality: N/A;   TONSILLECTOMY     TOTAL VAGINAL HYSTERECTOMY     fibroids      (Not in an outpatient encounter)    Allergies  Allergen Reactions   Adhesive [Tape]     Had moles removes on back and when they put bandage over it , it caused bumpiness underneath the bandage     Social History   Socioeconomic History   Marital status: Divorced    Spouse name: Not on file   Number of children: Not on file   Years of education: Not on file   Highest education level: Not on file  Occupational History   Not on file  Tobacco Use   Smoking status: Former    Current packs/day: 0.00    Types: Cigarettes    Quit date: 42    Years since quitting: 36.6   Smokeless tobacco: Never   Tobacco comments:    smoked 13 years ago 1/2 ppd   Vaping Use   Vaping status: Never Used  Substance and Sexual Activity   Alcohol use: Yes    Comment: occasionally    Drug use: Never   Sexual activity: Not Currently    Birth control/protection: Surgical    Comment: Hysterectomy 2008   Other Topics Concern   Not on file  Social History Narrative   Safe in relationship    Owns guns    Wears seat Teacher, 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    Social Determinants of Health   Financial Resource Strain: Not on file  Food Insecurity: No Food Insecurity (04/26/2022)   Hunger Vital Sign    Worried About Running Out of Food in the Last Year: Never true    Ran Out of Food in the Last Year: Never true  Transportation Needs: No Transportation Needs (04/26/2022)   PRAPARE - Administrator, Civil Service (Medical): No    Lack of Transportation (Non-Medical): No  Physical Activity: Not on file  Stress: Not on file  Social Connections: Not on file  Intimate Partner Violence: Not on file     History reviewed. No pertinent family history.   Review of Systems Pertinent items are noted in HPI.  Objective: Vital signs in last 24 hours:    10/31/2022    2:10 PM 10/17/2022    7:56 AM 09/20/2022    6:54 PM  Vitals with BMI  Height 5\' 9"  5\' 9"    Weight 170 lbs 178 lbs   BMI 25.09 26.27   Systolic  110 155  Diastolic  70 73  Pulse  86 71      EXAM: General: Well nourished, well developed. Awake, alert and oriented to time, place, person. Normal mood and affect. No apparent distress. Breathing room air.  Operative Lower Extremity: Alignment - Neutral Deformity - None Skin intact Tenderness to palpation - right 2nd and 3rd webspaces 5/5 TA, PT, GS, Per, EHL, FHL Sensation intact to light touch throughout Palpable DP and PT pulses Special testing: None  The contralateral foot/ankle was examined for comparison and noted to be neurovascularly intact with no localized deformity, swelling, or tenderness.  Imaging Review All images taken were independently reviewed by me.  Assessment/Plan: The clinical and radiographic findings were reviewed and discussed at length with the patient.  The patient has right recurrent 2nd and 3rd Morton's neuromas.  We spoke at length about the natural course of these findings. We discussed nonoperative and operative treatment options in detail.  The risks and  benefits were presented and reviewed. The risks due to neuroma recurrence, infection, stiffness, nerve/vessel/tendon injury, wound healing issues, failure of this surgery, need for further surgery, thromboembolic events, amputation, death among others were discussed. The patient acknowledged the explanation and agreed to proceed with the plan.  Madison [name]  Orthopaedic Surgery [facility]

## 2022-11-06 DIAGNOSIS — Z1231 Encounter for screening mammogram for malignant neoplasm of breast: Secondary | ICD-10-CM | POA: Diagnosis not present

## 2022-11-07 ENCOUNTER — Ambulatory Visit (HOSPITAL_BASED_OUTPATIENT_CLINIC_OR_DEPARTMENT_OTHER): Payer: BC Managed Care – PPO | Admitting: Certified Registered Nurse Anesthetist

## 2022-11-07 ENCOUNTER — Encounter (HOSPITAL_BASED_OUTPATIENT_CLINIC_OR_DEPARTMENT_OTHER): Payer: Self-pay | Admitting: Orthopaedic Surgery

## 2022-11-07 ENCOUNTER — Other Ambulatory Visit: Payer: Self-pay

## 2022-11-07 ENCOUNTER — Encounter (HOSPITAL_BASED_OUTPATIENT_CLINIC_OR_DEPARTMENT_OTHER)
Admission: RE | Disposition: A | Discharge status: Home / Self Care | Payer: Self-pay | Source: Home / Self Care | Attending: Orthopaedic Surgery

## 2022-11-07 ENCOUNTER — Ambulatory Visit (HOSPITAL_BASED_OUTPATIENT_CLINIC_OR_DEPARTMENT_OTHER)
Admission: RE | Admit: 2022-11-07 | Discharge: 2022-11-07 | Disposition: A | Discharge status: Home / Self Care | Payer: BC Managed Care – PPO | Attending: Orthopaedic Surgery | Admitting: Orthopaedic Surgery

## 2022-11-07 DIAGNOSIS — G8918 Other acute postprocedural pain: Secondary | ICD-10-CM | POA: Diagnosis not present

## 2022-11-07 DIAGNOSIS — I739 Peripheral vascular disease, unspecified: Secondary | ICD-10-CM | POA: Diagnosis not present

## 2022-11-07 DIAGNOSIS — G5761 Lesion of plantar nerve, right lower limb: Secondary | ICD-10-CM | POA: Insufficient documentation

## 2022-11-07 DIAGNOSIS — Z87891 Personal history of nicotine dependence: Secondary | ICD-10-CM | POA: Insufficient documentation

## 2022-11-07 DIAGNOSIS — M1990 Unspecified osteoarthritis, unspecified site: Secondary | ICD-10-CM | POA: Diagnosis not present

## 2022-11-07 DIAGNOSIS — Z01818 Encounter for other preprocedural examination: Secondary | ICD-10-CM

## 2022-11-07 HISTORY — PX: EXCISION MORTON'S NEUROMA: SHX5013

## 2022-11-07 SURGERY — EXCISION, MORTON'S NEUROMA
Anesthesia: General | Site: Foot | Laterality: Right

## 2022-11-07 MED ORDER — SCOPOLAMINE 1 MG/3DAYS TD PT72
MEDICATED_PATCH | TRANSDERMAL | Status: AC
  Filled 2022-11-07: qty 1

## 2022-11-07 MED ORDER — PROPOFOL 10 MG/ML IV BOLUS
INTRAVENOUS | Status: AC
  Filled 2022-11-07: qty 20

## 2022-11-07 MED ORDER — ROPIVACAINE HCL 5 MG/ML IJ SOLN
INTRAMUSCULAR | Status: DC | PRN
  Administered 2022-11-07: 30 mL via PERINEURAL

## 2022-11-07 MED ORDER — VANCOMYCIN HCL 500 MG IV SOLR
INTRAVENOUS | Status: DC | PRN
  Administered 2022-11-07: 500 mg via TOPICAL

## 2022-11-07 MED ORDER — 0.9 % SODIUM CHLORIDE (POUR BTL) OPTIME
TOPICAL | Status: DC | PRN
  Administered 2022-11-07: 300 mL

## 2022-11-07 MED ORDER — PROPOFOL 10 MG/ML IV BOLUS
INTRAVENOUS | Status: DC | PRN
  Administered 2022-11-07: 200 mg via INTRAVENOUS

## 2022-11-07 MED ORDER — ONDANSETRON HCL 4 MG/2ML IJ SOLN
INTRAMUSCULAR | Status: AC
  Filled 2022-11-07: qty 2

## 2022-11-07 MED ORDER — ONDANSETRON HCL 4 MG/2ML IJ SOLN
INTRAMUSCULAR | Status: DC | PRN
Start: 2022-11-07 — End: 2022-11-07
  Administered 2022-11-07: 4 mg via INTRAVENOUS

## 2022-11-07 MED ORDER — FENTANYL CITRATE (PF) 100 MCG/2ML IJ SOLN
INTRAMUSCULAR | Status: AC
  Filled 2022-11-07: qty 2

## 2022-11-07 MED ORDER — CHLORHEXIDINE GLUCONATE 4 % EX SOLN: 60.0000 mL | Freq: Once | CUTANEOUS | Status: DC

## 2022-11-07 MED ORDER — LIDOCAINE HCL (CARDIAC) PF 100 MG/5ML IV SOSY
PREFILLED_SYRINGE | INTRAVENOUS | Status: DC | PRN
  Administered 2022-11-07: 50 mg via INTRAVENOUS

## 2022-11-07 MED ORDER — CEFAZOLIN SODIUM-DEXTROSE 2-4 GM/100ML-% IV SOLN
INTRAVENOUS | Status: AC
  Filled 2022-11-07: qty 100

## 2022-11-07 MED ORDER — FENTANYL CITRATE (PF) 100 MCG/2ML IJ SOLN
100.0000 ug | Freq: Once | INTRAMUSCULAR | Status: AC
  Administered 2022-11-07: 100 ug via INTRAVENOUS

## 2022-11-07 MED ORDER — MEPERIDINE HCL 25 MG/ML IJ SOLN: 6.2500 mg | INTRAMUSCULAR | Status: DC | PRN

## 2022-11-07 MED ORDER — VANCOMYCIN HCL 500 MG IV SOLR
INTRAVENOUS | Status: AC
  Filled 2022-11-07: qty 40

## 2022-11-07 MED ORDER — OXYCODONE HCL 5 MG/5ML PO SOLN: 5.0000 mg | Freq: Once | ORAL | Status: DC | PRN

## 2022-11-07 MED ORDER — MIDAZOLAM HCL 2 MG/2ML IJ SOLN
INTRAMUSCULAR | Status: AC
  Filled 2022-11-07: qty 2

## 2022-11-07 MED ORDER — PROPOFOL 500 MG/50ML IV EMUL
INTRAVENOUS | Status: DC | PRN
Start: 2022-11-07 — End: 2022-11-07
  Administered 2022-11-07: 100 ug/kg/min via INTRAVENOUS

## 2022-11-07 MED ORDER — LACTATED RINGERS IV SOLN: INTRAVENOUS | Status: DC

## 2022-11-07 MED ORDER — BUPIVACAINE-EPINEPHRINE (PF) 0.5% -1:200000 IJ SOLN
INTRAMUSCULAR | Status: AC
  Filled 2022-11-07: qty 120

## 2022-11-07 MED ORDER — MIDAZOLAM HCL 2 MG/2ML IJ SOLN
2.0000 mg | Freq: Once | INTRAMUSCULAR | Status: AC
  Administered 2022-11-07: 2 mg via INTRAVENOUS

## 2022-11-07 MED ORDER — HYDROMORPHONE HCL 1 MG/ML IJ SOLN: 0.2500 mg | INTRAMUSCULAR | Status: DC | PRN

## 2022-11-07 MED ORDER — DEXAMETHASONE SODIUM PHOSPHATE 4 MG/ML IJ SOLN
INTRAMUSCULAR | Status: DC | PRN
  Administered 2022-11-07: 5 mg via INTRAVENOUS

## 2022-11-07 MED ORDER — DEXAMETHASONE SODIUM PHOSPHATE 10 MG/ML IJ SOLN
INTRAMUSCULAR | Status: AC
  Filled 2022-11-07: qty 1

## 2022-11-07 MED ORDER — PROMETHAZINE HCL 25 MG/ML IJ SOLN: 6.2500 mg | INTRAMUSCULAR | Status: DC | PRN

## 2022-11-07 MED ORDER — LIDOCAINE 2% (20 MG/ML) 5 ML SYRINGE
INTRAMUSCULAR | Status: AC
  Filled 2022-11-07: qty 5

## 2022-11-07 MED ORDER — OXYCODONE HCL 5 MG PO TABS: 5.0000 mg | ORAL_TABLET | Freq: Once | ORAL | Status: DC | PRN

## 2022-11-07 MED ORDER — CEFAZOLIN SODIUM-DEXTROSE 2-4 GM/100ML-% IV SOLN
2.0000 g | INTRAVENOUS | Status: AC
  Administered 2022-11-07: 2 g via INTRAVENOUS

## 2022-11-07 MED ORDER — SCOPOLAMINE 1 MG/3DAYS TD PT72
1.0000 | MEDICATED_PATCH | TRANSDERMAL | Status: DC
  Administered 2022-11-07: 1.5 mg via TRANSDERMAL

## 2022-11-07 SURGICAL SUPPLY — 54 items
APL PRP STRL LF DISP 70% ISPRP (MISCELLANEOUS) ×1
BANDAGE ESMARK 6X9 LF (GAUZE/BANDAGES/DRESSINGS) IMPLANT
BLADE SURG 15 STRL LF DISP TIS (BLADE) ×2 IMPLANT
BLADE SURG 15 STRL SS (BLADE) ×2
BNDG CMPR 5X4 CHSV STRCH STRL (GAUZE/BANDAGES/DRESSINGS) ×1
BNDG CMPR 5X4 KNIT ELC UNQ LF (GAUZE/BANDAGES/DRESSINGS) ×1
BNDG CMPR 9X6 STRL LF SNTH (GAUZE/BANDAGES/DRESSINGS)
BNDG COHESIVE 4X5 TAN STRL LF (GAUZE/BANDAGES/DRESSINGS) ×1 IMPLANT
BNDG ELASTIC 4INX 5YD STR LF (GAUZE/BANDAGES/DRESSINGS) ×1 IMPLANT
BNDG ESMARK 6X9 LF (GAUZE/BANDAGES/DRESSINGS)
BNDG GAUZE DERMACEA FLUFF 4 (GAUZE/BANDAGES/DRESSINGS) ×1 IMPLANT
BNDG GZE DERMACEA 4 6PLY (GAUZE/BANDAGES/DRESSINGS) ×1
CANISTER SUCT 1200ML W/VALVE (MISCELLANEOUS) IMPLANT
CHLORAPREP W/TINT 26 (MISCELLANEOUS) ×1 IMPLANT
COVER BACK TABLE 60X90IN (DRAPES) ×1 IMPLANT
CUFF TOURN SGL QUICK 34 (TOURNIQUET CUFF) ×1
CUFF TRNQT CYL 34X4.125X (TOURNIQUET CUFF) ×1 IMPLANT
DRAPE EXTREMITY T 121X128X90 (DISPOSABLE) ×1 IMPLANT
DRAPE IMP U-DRAPE 54X76 (DRAPES) ×1 IMPLANT
DRAPE U-SHAPE 47X51 STRL (DRAPES) ×1 IMPLANT
DRSG MEPITEL 4X7.2 (GAUZE/BANDAGES/DRESSINGS) ×1 IMPLANT
ELECT REM PT RETURN 9FT ADLT (ELECTROSURGICAL) ×1
ELECTRODE REM PT RTRN 9FT ADLT (ELECTROSURGICAL) ×1 IMPLANT
GAUZE PAD ABD 8X10 STRL (GAUZE/BANDAGES/DRESSINGS) IMPLANT
GAUZE SPONGE 4X4 12PLY STRL (GAUZE/BANDAGES/DRESSINGS) ×1 IMPLANT
GLOVE BIOGEL PI IND STRL 8 (GLOVE) ×1 IMPLANT
GLOVE SURG SS PI 7.5 STRL IVOR (GLOVE) ×1 IMPLANT
GOWN STRL REUS W/ TWL LRG LVL3 (GOWN DISPOSABLE) ×2 IMPLANT
GOWN STRL REUS W/TWL LRG LVL3 (GOWN DISPOSABLE) ×2
NDL HYPO 22X1.5 SAFETY MO (MISCELLANEOUS) IMPLANT
NEEDLE HYPO 22X1.5 SAFETY MO (MISCELLANEOUS) IMPLANT
NS IRRIG 1000ML POUR BTL (IV SOLUTION) ×1 IMPLANT
PACK BASIN DAY SURGERY FS (CUSTOM PROCEDURE TRAY) ×1 IMPLANT
PAD CAST 4YDX4 CTTN HI CHSV (CAST SUPPLIES) IMPLANT
PADDING CAST ABS COTTON 4X4 ST (CAST SUPPLIES) IMPLANT
PADDING CAST COTTON 4X4 STRL (CAST SUPPLIES)
PENCIL SMOKE EVACUATOR (MISCELLANEOUS) IMPLANT
SHEET MEDIUM DRAPE 40X70 STRL (DRAPES) ×1 IMPLANT
SLEEVE SCD COMPRESS KNEE MED (STOCKING) ×1 IMPLANT
SPIKE FLUID TRANSFER (MISCELLANEOUS) IMPLANT
SPONGE T-LAP 18X18 ~~LOC~~+RFID (SPONGE) ×1 IMPLANT
STOCKINETTE 6 STRL (DRAPES) ×1 IMPLANT
SUCTION TUBE FRAZIER 10FR DISP (SUCTIONS) ×1 IMPLANT
SUT ETHILON 2 0 FS 18 (SUTURE) IMPLANT
SUT ETHILON 3 0 PS 1 (SUTURE) IMPLANT
SUT VIC AB 2-0 SH 27 (SUTURE)
SUT VIC AB 2-0 SH 27XBRD (SUTURE) IMPLANT
SUT VIC AB 3-0 PS2 18 (SUTURE) ×1
SUT VIC AB 3-0 PS2 18XBRD (SUTURE) IMPLANT
SUT VICRYL 0 SH 27 (SUTURE) IMPLANT
SYR BULB EAR ULCER 3OZ GRN STR (SYRINGE) ×1 IMPLANT
TOWEL GREEN STERILE FF (TOWEL DISPOSABLE) ×2 IMPLANT
TUBE CONNECTING 20X1/4 (TUBING) ×1 IMPLANT
UNDERPAD 30X36 HEAVY ABSORB (UNDERPADS AND DIAPERS) ×1 IMPLANT

## 2022-11-07 NOTE — Anesthesia Procedure Notes (Signed)
Anesthesia Regional Block: Popliteal block   Pre-Anesthetic Checklist: , timeout performed,  Correct Patient, Correct Site, Correct Laterality,  Correct Procedure, Correct Position, site marked,  Risks and benefits discussed,  Surgical consent,  Pre-op evaluation,  At surgeon's request and post-op pain management  Laterality: Right  Prep: chloraprep       Needles:  Injection technique: Single-shot  Needle Type: Stimiplex     Needle Length: 9cm  Needle Gauge: 21     Additional Needles:   Procedures:,,,, ultrasound used (permanent image in chart),,    Narrative:  Start time: 11/07/2022 8:16 AM End time: 11/07/2022 8:21 AM Injection made incrementally with aspirations every 5 mL.  Performed by: Personally  Anesthesiologist: Lowella Curb, MD

## 2022-11-07 NOTE — H&P (Addendum)
H&P Update:  -History and Physical Reviewed  -Patient has been re-examined  -No change in the plan of care  -The risks and benefits were presented and reviewed. The risks due to recurrent neuroma, permanent numbness/tingling, new/persistent infection, stiffness, nerve/vessel/tendon injury or rerupture of repaired tendon, nonunion/malunion, allograft usage, wound healing issues, development of arthritis, failure of this surgery, possibility of external fixation with delayed definitive surgery, need for further surgery, thromboembolic events, anesthesia/medical complications, amputation, death among others were discussed. Specifically we discussed decompression with or without excision of 2nd and/or 3rd neuromas given this is potentially a recurrence. The patient acknowledged the explanation, agreed to proceed with the plan and a consent was signed.  Netta Cedars

## 2022-11-07 NOTE — Transfer of Care (Signed)
Immediate Anesthesia Transfer of Care Note  Patient: Madison [name]  Procedure(s) Performed: RIGHT 2ND AND 3RD MORTON'S NEUROMA REVISION EXCISION AND DECOMPRESSION (Right: Foot)  Patient Location: PACU  Anesthesia Type:GA combined with regional for post-op pain  Level of Consciousness: awake, alert , and oriented  Airway & Oxygen Therapy: Patient Spontanous Breathing and Patient connected to face mask oxygen  Post-op Assessment: Report given to RN and Post -op Vital signs reviewed and stable  Post vital signs: Reviewed and stable  Last Vitals:  Vitals Value Taken Time  BP    Temp    Pulse    Resp    SpO2      Last Pain:  Vitals:   11/07/22 0710  TempSrc: Tympanic  PainSc: 0-No pain      Patients Stated Pain Goal: 5 (11/07/22 0710)  Complications: No notable events documented.

## 2022-11-07 NOTE — Progress Notes (Signed)
Assisted Dr. Hyacinth Meeker with right popliteal block. Side rails up, monitors on throughout procedure. See vital signs in flow sheet. Tolerated Procedure well.

## 2022-11-07 NOTE — Anesthesia Preprocedure Evaluation (Signed)
Anesthesia Evaluation  Patient identified by MRN, date of birth, ID band Patient awake    Reviewed: Allergy & Precautions, NPO status , Patient's Chart, lab work & pertinent test results  History of Anesthesia Complications (+) PONV and history of anesthetic complications  Airway Mallampati: II  TM Distance: >3 FB Neck ROM: Full    Dental no notable dental hx.    Pulmonary former smoker   Pulmonary exam normal breath sounds clear to auscultation       Cardiovascular + Peripheral Vascular Disease  Normal cardiovascular exam Rhythm:Regular Rate:Normal     Neuro/Psych    GI/Hepatic negative GI ROS, Neg liver ROS,,,  Endo/Other  Hypothyroidism  History noted. CG  Renal/GU negative Renal ROS     Musculoskeletal  (+) Arthritis , Osteoarthritis,    Abdominal   Peds  Hematology   Anesthesia Other Findings   Reproductive/Obstetrics                             Anesthesia Physical Anesthesia Plan  ASA: 2  Anesthesia Plan: General   Post-op Pain Management: Regional block* and Minimal or no pain anticipated   Induction: Intravenous  PONV Risk Score and Plan: 4 or greater and Treatment may vary due to age or medical condition, Ondansetron, Dexamethasone, Midazolam and Droperidol  Airway Management Planned: LMA  Additional Equipment:   Intra-op Plan:   Post-operative Plan: Extubation in OR  Informed Consent: I have reviewed the patients History and Physical, chart, labs and discussed the procedure including the risks, benefits and alternatives for the proposed anesthesia with the patient or authorized representative who has indicated his/her understanding and acceptance.     Dental advisory given  Plan Discussed with: CRNA and Anesthesiologist  Anesthesia Plan Comments:         Anesthesia Quick Evaluation

## 2022-11-07 NOTE — Anesthesia Procedure Notes (Addendum)
Procedure Name: LMA Insertion Date/Time: 11/07/2022 8:38 AM  Performed by: Cleda Clarks, CRNAPre-anesthesia Checklist: Patient identified, Emergency Drugs available, Suction available and Patient being monitored Patient Re-evaluated:Patient Re-evaluated prior to induction Oxygen Delivery Method: Circle system utilized Preoxygenation: Pre-oxygenation with 100% oxygen Induction Type: IV induction Ventilation: Mask ventilation without difficulty LMA: LMA inserted LMA Size: 4.0 Number of attempts: 1 Airway Equipment and Method: Bite block Placement Confirmation: positive ETCO2 Tube secured with: Tape Dental Injury: Teeth and Oropharynx as per pre-operative assessment

## 2022-11-07 NOTE — Anesthesia Postprocedure Evaluation (Signed)
Anesthesia Post Note  Patient: Madison [name]  Procedure(s) Performed: RIGHT 2ND AND 3RD MORTON'S NEUROMA REVISION EXCISION AND DECOMPRESSION (Right: Foot)     Patient location during evaluation: PACU Anesthesia Type: General Level of consciousness: awake and alert Pain management: pain level controlled Vital Signs Assessment: post-procedure vital signs reviewed and stable Respiratory status: spontaneous breathing, nonlabored ventilation and respiratory function stable Cardiovascular status: blood pressure returned to baseline and stable Postop Assessment: no apparent nausea or vomiting Anesthetic complications: no   No notable events documented.  Last Vitals:  Vitals:   11/07/22 0930 11/07/22 0955  BP:  (!) 120/57  Pulse:  62  Resp:    Temp:    SpO2: 99%     Last Pain:  Vitals:   11/07/22 0955  TempSrc:   PainSc: 0-No pain                 Lowella Curb

## 2022-11-07 NOTE — Op Note (Signed)
11/07/2022  11:16 AM   PATIENT: Madison [name]  62 y.o. 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   INDICATION FOR PROCEDURE: The patient presented with above diagnosis. The patient previously had 3rd webspace neuroma resection in 2023. The MRI and exam demonstrated 2nd webspace neuroma which temporarily relieved with cortisone injection several months ago. The MRI did not demonstrate recurrence of 3rd webspace neuroma. As discussed with the patient, we proceeded with right 2nd webspace Morton's neuroma resection today.  We discussed the diagnosis, alternative treatment options, risks and benefits of the above surgical intervention, as well as alternative non-operative treatments. All questions/concerns were addressed and the patient/family demonstrated appropriate understanding of the diagnosis, the procedure, the postoperative course, and overall prognosis. The patient wished to proceed with surgical intervention and signed an informed surgical consent as such, in each others presence prior to surgery.   PROCEDURE IN DETAIL: After preoperative consent was obtained and the correct operative site was identified, the patient was brought to the operating room supine on stretcher and transferred onto operating table. General anesthesia was induced. Preoperative antibiotics were administered. Surgical timeout was taken. The patient was then positioned supine with an ipsilateral hip bump. The operative lower extremity was prepped and  draped in standard sterile fashion with a tourniquet around the thigh. The extremity was exsanguinated and the tourniquet was inflated to 275 mmHg.  We began by utilizing the prior dorsal incision scar centered over the 2nd metatarsal. The dissection was carefully carried down to the level of the extensor tendons and these were retracted. We then carried dissection down to the level of the transverse metatarsal ligament 2nd webspace which was noted to be thickened. This ligament was released and the Morton's neuroma of the 2nd webspace was readily identified. This was dissected proximally and resected sharply as proximally as possible. The neuroma along with the perineural fibrosis were excised and sent for pathology analysis.  The surgical sites were thoroughly irrigated. The tourniquet was deflated and hemostasis achieved. Betadine and vancomycin powder were applied. The deep layers were closed using 2-0 vicryl. The skin was closed without tension using 2-0 nylon suture. All toes were noted to have excellent capillary refill and foot had bounding pulses.   The leg was cleaned with saline and sterile dressings with gauze were applied. A well padded loose wrap was applied. The patient was awakened from anesthesia and transported to the recovery room in stable condition.    FOLLOW UP PLAN: -transfer to PACU, then home -strict heel weight bearing operative extremity once nerve block wears off, maximum elevation -dressings until 48 hours then patient to change daily -DVT ppx: Aspirin 81 mg twice daily until full weightbearing -follow up as outpatient within 7-10 days for wound check -sutures out in 2-3 weeks in outpatient office   RADIOGRAPHS: None used   Netta Cedars Orthopaedic Surgery [facility]

## 2022-11-08 ENCOUNTER — Encounter (HOSPITAL_BASED_OUTPATIENT_CLINIC_OR_DEPARTMENT_OTHER): Payer: Self-pay | Admitting: Orthopaedic Surgery

## 2022-11-08 DIAGNOSIS — Z713 Dietary counseling and surveillance: Secondary | ICD-10-CM | POA: Diagnosis not present

## 2022-11-08 DIAGNOSIS — Z6826 Body mass index (BMI) 26.0-26.9, adult: Secondary | ICD-10-CM | POA: Diagnosis not present

## 2022-11-08 LAB — SURGICAL PATHOLOGY

## 2022-11-09 ENCOUNTER — Encounter: Payer: Self-pay | Admitting: Nurse Practitioner

## 2022-11-21 DIAGNOSIS — L814 Other melanin hyperpigmentation: Secondary | ICD-10-CM | POA: Diagnosis not present

## 2022-11-21 DIAGNOSIS — L821 Other seborrheic keratosis: Secondary | ICD-10-CM | POA: Diagnosis not present

## 2022-11-21 DIAGNOSIS — Z808 Family history of malignant neoplasm of other organs or systems: Secondary | ICD-10-CM | POA: Diagnosis not present

## 2022-11-21 DIAGNOSIS — L578 Other skin changes due to chronic exposure to nonionizing radiation: Secondary | ICD-10-CM | POA: Diagnosis not present

## 2022-12-06 DIAGNOSIS — Z6826 Body mass index (BMI) 26.0-26.9, adult: Secondary | ICD-10-CM | POA: Diagnosis not present

## 2022-12-06 DIAGNOSIS — Z713 Dietary counseling and surveillance: Secondary | ICD-10-CM | POA: Diagnosis not present

## 2022-12-21 DIAGNOSIS — Z23 Encounter for immunization: Secondary | ICD-10-CM | POA: Diagnosis not present

## 2023-05-02 DIAGNOSIS — G5761 Lesion of plantar nerve, right lower limb: Secondary | ICD-10-CM | POA: Diagnosis not present

## 2023-06-07 ENCOUNTER — Encounter: Payer: BC Managed Care – PPO | Admitting: Internal Medicine

## 2023-06-11 DIAGNOSIS — Z713 Dietary counseling and surveillance: Secondary | ICD-10-CM | POA: Diagnosis not present

## 2023-06-11 DIAGNOSIS — Z6825 Body mass index (BMI) 25.0-25.9, adult: Secondary | ICD-10-CM | POA: Diagnosis not present

## 2023-06-26 ENCOUNTER — Encounter: Payer: Self-pay | Admitting: Internal Medicine

## 2023-06-26 ENCOUNTER — Ambulatory Visit (INDEPENDENT_AMBULATORY_CARE_PROVIDER_SITE_OTHER): Payer: BC Managed Care – PPO | Admitting: Internal Medicine

## 2023-06-26 VITALS — BP 136/80 | HR 75 | Ht 69.0 in | Wt 180.0 lb

## 2023-06-26 DIAGNOSIS — E890 Postprocedural hypothyroidism: Secondary | ICD-10-CM | POA: Diagnosis not present

## 2023-06-26 DIAGNOSIS — C73 Malignant neoplasm of thyroid gland: Secondary | ICD-10-CM

## 2023-06-26 NOTE — Patient Instructions (Signed)
 Please continue Levothyroxine 100 mcg daily.  Take the thyroid hormone every day, with water, at least 30 minutes before breakfast, separated by at least 4 hours from: - acid reflux medications - calcium - iron - multivitamins  Please stop at the lab.  We will also schedule a neck ultrasound.  Please come back for a follow-up appointment in 1 year.

## 2023-06-26 NOTE — Progress Notes (Signed)
 Patient ID: Madison [name], female   DOB: [birth year], 63 y.o.   MRN: [id number]  HPI  Madison [name] is a 63 y.o.-year-old female, initially referred by Dr. 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  She is was seeing Dr. Tedd Sias before.  Last visit with me 1 year ago.  Interval history: No neck compression symptoms or other complaints at today's visit. Since last visit she had Morton's neuroma surgery 10/2022.  Reviewed history: She felt a nodule in her neck 03/2017.  She presented to see her PCP who ordered a thyroid ultrasound.  A large thyroid nodule was seen in the left neck.  This was biopsied and the lesion was inconclusive.  She was then sent for left hemithyroidectomy which showed Hurthle cell cancer.  Reviewed patient's chart and Dr. Pricilla Handler notes: 09/10/2017: FNA of the left thyroid nodule: Cystic components (scant cellular material), inconclusive 12/02/2017: Left hemithyroidectomy-Dr. Gerrit Friends. Pathology: 3.5 x 2.6 x 2.2 cm Hurthle cell carcinoma with negative margins and no lymphatic invasion but with angioinvasion.  No lymph nodes were submitted then.  Staging: pT2Nx 12/11/2017: Total thyroidectomy-Dr. Gerrit Friends Pathology: Right lobe benign 01/06/2018: RAI treatment with 75 mCi Post ablative WBS: 2 foci of uptake in the thyroid bed, no distant uptake 05/29/2018: Neck ultrasound: Normal changes after thyroidectomy without significant anterior neck adenopathy 02/16/2019: Whole-body scan: interval resolution of activity in the thyroid bed, no metastases  Her thyroglobulin levels and antibodies have been undetectable: Lab Results  Component Value Date   THYROGLB <0.1 (L) 06/05/2022   THYROGLB <0.1 (L) 06/02/2021   THYROGLB <0.1 (L) 06/22/2020   THYROGLB <0.1 (L) 06/23/2019   THYROGLB <2.0 12/23/2018   Lab Results  Component Value Date   THGAB <1.0 06/05/2022   THGAB <1.0 06/02/2021   THGAB <1.0 06/22/2020   THGAB <1.0 06/23/2019    THGAB <1.0 12/23/2018  05/28/2018: Tg <0.1, ATA <1  02/18/2018: Tg <0.1, ATA <1  Pt denies: - feeling nodules in neck - hoarseness - dysphagia - choking  Postsurgical hypothyroidism  Pt is on levothyroxine 100 mcg daily, taken: - in am (5:30 am) - fasting - at least 30 min from b'fast - no Ca, Fe,  PPIs, stopped multivitamins at night or lunchtime - stopped Biotin - + collagen powder in almond milk later in am  Reviewed her TFTs: Lab Results  Component Value Date   TSH 1.72 06/05/2022   TSH 1.02 06/02/2021   TSH 1.23 06/22/2020   TSH 0.74 06/23/2019   TSH 1.74 12/23/2018   TSH 2.03 07/18/2017   FREET4 0.96 06/05/2022   FREET4 0.98 06/02/2021   FREET4 1.13 06/22/2020   FREET4 1.05 06/23/2019   FREET4 1.07 12/23/2018   FREET4 0.83 07/18/2017  09/15/2018: TSH 1.738 05/28/2018: TSH 0.907 02/18/2018: TSH 0.352    She previously had some palpitations, which resolved.  No family history of thyroid disorders or thyroid cancer. No history of radiation therapy to head or neck.  She had radiotherapy as a child after an infected vaccine wound.   ROS: + see HPI  I reviewed pt's medications, allergies, PMH, social hx, family hx, and changes were documented in the history of present illness. Otherwise, unchanged from my initial visit note.  Past Medical History:  Diagnosis Date   Chicken pox    Fibroid    PONV (postoperative nausea and vomiting)    Thyroid neoplasm    Past Surgical History:  Procedure Laterality Date   ABLATION  AUGMENTATION MAMMAPLASTY     BREAST ENHANCEMENT SURGERY     2010   BREAST SURGERY     breast bx 2015 benign breasts cysts    EXCISION MORTON'S NEUROMA Right 11/07/2022   Procedure: RIGHT 2ND AND 3RD MORTON'S NEUROMA REVISION EXCISION AND DECOMPRESSION;  Surgeon: Netta Cedars, MD;  Location: [facility];  Service: Orthopedics;  Laterality: Right;   FOOT SURGERY     left removed partial nerve    THYROID LOBECTOMY Left  12/02/2017   Procedure: LEFT THYROID LOBECTOMY;  Surgeon: Darnell Level, MD;  Location: [facility];  Service: General;  Laterality: Left;   THYROIDECTOMY N/A 12/10/2017   Procedure: COMPLETION THYROIDECTOMY;  Surgeon: Darnell Level, MD;  Location: [facility];  Service: General;  Laterality: N/A;   TONSILLECTOMY     TOTAL VAGINAL HYSTERECTOMY     fibroids    Social History   Socioeconomic History   Marital status: Divorced    Spouse name: Not on file   Number of children: Not on file   Years of education: Not on file   Highest education level: Not on file  Occupational History   Not on file  Tobacco Use   Smoking status: Former    Current packs/day: 0.00    Types: Cigarettes    Quit date: 50    Years since quitting: 37.2   Smokeless tobacco: Never   Tobacco comments:    smoked 13 years ago 1/2 ppd   Vaping Use   Vaping status: Never Used  Substance and Sexual Activity   Alcohol use: Yes    Comment: occasionally    Drug use: Never   Sexual activity: Not Currently    Birth control/protection: Surgical    Comment: Hysterectomy 2008   Other Topics Concern   Not on file  Social History Narrative   Safe in relationship    Owns guns    Wears seat Biochemist, clinical    2 kids    Social Drivers of Health   Financial Resource Strain: Not on file  Food Insecurity: No Food Insecurity (04/26/2022)   Hunger Vital Sign    Worried About Running Out of Food in the Last Year: Never true    Ran Out of Food in the Last Year: Never true  Transportation Needs: No Transportation Needs (04/26/2022)   PRAPARE - Administrator, Civil Service (Medical): No    Lack of Transportation (Non-Medical): No  Physical Activity: Not on file  Stress: Not on file  Social Connections: Not on file  Intimate Partner Violence: Not on file   Current Outpatient Medications on File Prior to Visit  Medication Sig Dispense Refill   levothyroxine (SYNTHROID) 100 MCG tablet Take 1 tablet (100  mcg total) by mouth daily. 90 tablet 3   No current facility-administered medications on file prior to visit.   Allergies  Allergen Reactions   Adhesive [Tape]     Had moles removes on back and when they put bandage over it , it caused bumpiness underneath the bandage    Family History  Problem Relation Age of Onset   Diabetes Mother    Heart disease Mother    Depression Mother    Hip fracture Mother    Alcohol abuse Father    Cancer Father        mesthelioma house with asbestos    Skin cancer Sister    Skin cancer Brother    Bladder Cancer  Maternal Aunt    Breast cancer Paternal Aunt    PE: BP 136/80   Pulse 75   Ht 5\' 9"  (1.753 m)   Wt 180 lb (81.6 kg)   SpO2 97%   BMI 26.58 kg/m  Wt Readings from Last 3 Encounters:  06/26/23 180 lb (81.6 kg)  11/07/22 174 lb 9.7 oz (79.2 kg)  10/17/22 178 lb (80.7 kg)   Constitutional: Slightly overweight, in NAD Eyes: EOMI, no exophthalmos ENT no neck masses palpated, + thyroidectomy scar healed, no cervical lymphadenopathy Cardiovascular: RRR, No MRG Respiratory: CTA B Musculoskeletal: no deformities Skin: no rashes Neurological: no tremor with outstretched hands  ASSESSMENT: 1.  Hurthle cell (oncocytic) thyroid cancer - see HPI  2. Postsurgical Hypothyroidism  PLAN:  1.  Hurthle cell (oncocytic) thyroid cancer - Patient with a history of Hurthle cell thyroid cancer without lymphatic invasion but with angioinvasion.  Because of this, she is at a slightly higher risk of metastasis.  In general, thyroid cancer is a slow-growing cancer, with good prognosis, but this is a more unusual type of cancer, which may be less responsive to RAI treatment.  At the end of 2020, due to the slightly higher risk of this cancer compared to the classic papillary type cancer, we checked a whole-body scan and this was negative for metastases or recurrences.  Also, there was no increase in activity in the thyroid bed.  For now, we are following her  with neck ultrasounds on an as-needed basis and also thyroglobulin. - At today's visit, I suggested to check another neck ultrasound.  Will also recheck her thyroglobulin and thyroglobulin antibodies.  Previous values were undetectable, which is excellent. - She has no neck masses palpated on today's exam and no neck compression symptoms - I plan to see her back in a year, but possibly sooner depending on the results  2. Patient with history of total thyroidectomy for thyroid cancer, now with iatrogenic hypothyroidism, on levothyroxine therapy. - latest thyroid labs reviewed with pt. >> normal: Lab Results  Component Value Date   TSH 1.72 06/05/2022  - she continues on LT4 100 mcg daily - pt feels good on this dose.  At last visit, she started to have hot flashes.  She previously had hysterectomy at 63 years old with subsequent hot flashes, but not bothersome at that time.  We did discuss that menopausal hot flashes can recur in a fluctuating fashion.  The TSH was not suppressed at that time to account for the hot flashes.  She has no complaints at today's visit. - we discussed about taking the thyroid hormone every day, with water, >30 minutes before breakfast, separated by >4 hours from acid reflux medications, calcium, iron, multivitamins. Pt. is taking it correctly. - will check thyroid tests today: TSH and fT4 - If labs are abnormal, she will need to return for repeat TFTs in 1.5 months - OTW, I will see her back in a year  Needs refills - [facility].  Orders Placed This Encounter  Procedures   US THYROID   TSH   T4, free   Thyroglobulin antibody   Thyroglobulin Level   Carlus Pavlov, MD PhD [facility] Endocrinology

## 2023-06-27 ENCOUNTER — Encounter: Payer: Self-pay | Admitting: Internal Medicine

## 2023-06-27 LAB — TSH: TSH: 1.9 m[IU]/L (ref 0.40–4.50)

## 2023-06-27 LAB — THYROGLOBULIN LEVEL: Thyroglobulin: 0.1 ng/mL — ABNORMAL LOW

## 2023-06-27 LAB — THYROGLOBULIN ANTIBODY: Thyroglobulin Ab: <1 [IU]/mL (ref <=1)

## 2023-06-27 LAB — T4, FREE: Free T4: 1.5 ng/dL (ref 0.8–1.8)

## 2023-06-27 MED ORDER — LEVOTHYROXINE SODIUM 100 MCG PO TABS
100.0000 ug | ORAL_TABLET | Freq: Every day | ORAL | 3 refills | Status: AC
Start: 2023-06-27 — End: ?

## 2023-06-27 NOTE — Addendum Note (Signed)
 Addended by: Carlus Pavlov on: 06/27/2023 05:15 PM   Modules accepted: Orders

## 2023-07-08 DIAGNOSIS — F5101 Primary insomnia: Secondary | ICD-10-CM | POA: Diagnosis not present

## 2023-07-08 DIAGNOSIS — F03B2 Unspecified dementia, moderate, with psychotic disturbance: Secondary | ICD-10-CM | POA: Diagnosis not present

## 2023-07-11 DIAGNOSIS — L719 Rosacea, unspecified: Secondary | ICD-10-CM | POA: Diagnosis not present

## 2023-07-11 DIAGNOSIS — D225 Melanocytic nevi of trunk: Secondary | ICD-10-CM | POA: Diagnosis not present

## 2023-07-11 DIAGNOSIS — D229 Melanocytic nevi, unspecified: Secondary | ICD-10-CM | POA: Diagnosis not present

## 2023-07-11 DIAGNOSIS — D485 Neoplasm of uncertain behavior of skin: Secondary | ICD-10-CM | POA: Diagnosis not present

## 2023-07-11 DIAGNOSIS — L538 Other specified erythematous conditions: Secondary | ICD-10-CM | POA: Diagnosis not present

## 2023-07-11 DIAGNOSIS — L814 Other melanin hyperpigmentation: Secondary | ICD-10-CM | POA: Diagnosis not present

## 2023-07-15 DIAGNOSIS — Z713 Dietary counseling and surveillance: Secondary | ICD-10-CM | POA: Diagnosis not present

## 2023-08-05 ENCOUNTER — Ambulatory Visit
Admission: RE | Admit: 2023-08-05 | Discharge: 2023-08-05 | Disposition: A | Discharge status: Home / Self Care | Source: Ambulatory Visit | Attending: Internal Medicine | Admitting: Internal Medicine

## 2023-08-05 DIAGNOSIS — C73 Malignant neoplasm of thyroid gland: Secondary | ICD-10-CM

## 2023-08-06 DIAGNOSIS — Z85850 Personal history of malignant neoplasm of thyroid: Secondary | ICD-10-CM | POA: Diagnosis not present

## 2023-08-08 ENCOUNTER — Ambulatory Visit: Payer: Self-pay | Admitting: Internal Medicine

## 2023-08-22 DIAGNOSIS — Z6825 Body mass index (BMI) 25.0-25.9, adult: Secondary | ICD-10-CM | POA: Diagnosis not present

## 2023-08-22 DIAGNOSIS — Z713 Dietary counseling and surveillance: Secondary | ICD-10-CM | POA: Diagnosis not present

## 2023-09-17 DIAGNOSIS — Z713 Dietary counseling and surveillance: Secondary | ICD-10-CM | POA: Diagnosis not present

## 2023-09-17 DIAGNOSIS — Z6826 Body mass index (BMI) 26.0-26.9, adult: Secondary | ICD-10-CM | POA: Diagnosis not present

## 2023-09-23 ENCOUNTER — Encounter: Payer: Self-pay | Admitting: Internal Medicine

## 2023-09-23 ENCOUNTER — Ambulatory Visit (INDEPENDENT_AMBULATORY_CARE_PROVIDER_SITE_OTHER): Admitting: Internal Medicine

## 2023-09-23 VITALS — BP 116/80 | HR 65 | Temp 98.2°F | Ht 69.0 in | Wt 180.0 lb

## 2023-09-23 DIAGNOSIS — E559 Vitamin D deficiency, unspecified: Secondary | ICD-10-CM

## 2023-09-23 DIAGNOSIS — E890 Postprocedural hypothyroidism: Secondary | ICD-10-CM

## 2023-09-23 DIAGNOSIS — Z0000 Encounter for general adult medical examination without abnormal findings: Secondary | ICD-10-CM

## 2023-09-23 LAB — COMPREHENSIVE METABOLIC PANEL WITH GFR
ALT: 13 U/L (ref 0–35)
AST: 18 U/L (ref 0–37)
Albumin: 4.2 g/dL (ref 3.5–5.2)
Alkaline Phosphatase: 65 U/L (ref 39–117)
BUN: 20 mg/dL (ref 6–23)
CO2: 29 meq/L (ref 19–32)
Calcium: 9 mg/dL (ref 8.4–10.5)
Chloride: 104 meq/L (ref 96–112)
Creatinine, Ser: 0.77 mg/dL (ref 0.40–1.20)
GFR: 81.28 mL/min (ref >60.00)
Glucose, Bld: 91 mg/dL (ref 70–99)
Potassium: 3.9 meq/L (ref 3.5–5.1)
Sodium: 140 meq/L (ref 135–145)
Total Bilirubin: 0.5 mg/dL (ref 0.2–1.2)
Total Protein: 6.8 g/dL (ref 6.0–8.3)

## 2023-09-23 LAB — CBC
HCT: 40.9 % (ref 36.0–46.0)
Hemoglobin: 13.9 g/dL (ref 12.0–15.0)
MCHC: 34.1 g/dL (ref 30.0–36.0)
MCV: 93.6 fl (ref 78.0–100.0)
Platelets: 309 K/uL (ref 150.0–400.0)
RBC: 4.37 Mil/uL (ref 3.87–5.11)
RDW: 12.8 % (ref 11.5–15.5)
WBC: 6.6 K/uL (ref 4.0–10.5)

## 2023-09-23 NOTE — Patient Instructions (Signed)
 MMR at the pharmacy.

## 2023-09-23 NOTE — Progress Notes (Unsigned)
   Subjective:   Patient ID: Madison [name], female    DOB: Apr 11, 1960, 63 y.o.   MRN: [id number]  HPI The patient is here for physical.  PMH, [facility], social history reviewed and updated  Review of Systems  Constitutional: Negative.   HENT: Negative.    Eyes: Negative.   Respiratory:  Negative for cough, chest tightness and shortness of breath.   Cardiovascular:  Negative for chest pain, palpitations and leg swelling.  Gastrointestinal:  Negative for abdominal distention, abdominal pain, constipation, diarrhea, nausea and vomiting.  Musculoskeletal: Negative.   Skin: Negative.   Neurological: Negative.   Psychiatric/Behavioral: Negative.      Objective:  Physical Exam Constitutional:      Appearance: She is well-developed.  HENT:     Head: Normocephalic and atraumatic.  Cardiovascular:     Rate and Rhythm: Normal rate and regular rhythm.  Pulmonary:     Effort: Pulmonary effort is normal. No respiratory distress.     Breath sounds: Normal breath sounds. No wheezing or rales.  Abdominal:     General: Bowel sounds are normal. There is no distension.     Palpations: Abdomen is soft.     Tenderness: There is no abdominal tenderness. There is no rebound.  Musculoskeletal:     Cervical back: Normal range of motion.  Skin:    General: Skin is warm and dry.  Neurological:     Mental Status: She is alert and oriented to person, place, and time.     Coordination: Coordination normal.     Vitals:   09/23/23 1457  BP: 116/80  Pulse: 65  Temp: 98.2 F (36.8 C)  TempSrc: Oral  SpO2: 97%  Weight: 180 lb (81.6 kg)  Height: 5' 9 (1.753 m)    Assessment & Plan:

## 2023-09-24 LAB — VITAMIN D 25 HYDROXY (VIT D DEFICIENCY, FRACTURES): VITD: 20.54 ng/mL — ABNORMAL LOW (ref 30.00–100.00)

## 2023-09-26 ENCOUNTER — Encounter: Payer: Self-pay | Admitting: Internal Medicine

## 2023-09-26 NOTE — Assessment & Plan Note (Signed)
Flu shot yearly. Shingrix complete. Tetanus up to date. Colonoscopy up to date. Mammogram up to date, pap smear up to date. Counseled about sun safety and mole surveillance. Counseled about the dangers of distracted driving. Given 10 year screening recommendations.

## 2023-09-26 NOTE — Assessment & Plan Note (Signed)
 Checking vitamin D  adjust as needed.

## 2023-09-26 NOTE — Assessment & Plan Note (Signed)
 Endo does labs and taking levothyroxine  100 mcg daily.

## 2023-09-30 ENCOUNTER — Ambulatory Visit: Payer: Self-pay | Admitting: Internal Medicine

## 2023-10-21 ENCOUNTER — Encounter: Admitting: Internal Medicine

## 2023-10-22 DIAGNOSIS — Z713 Dietary counseling and surveillance: Secondary | ICD-10-CM | POA: Diagnosis not present

## 2023-10-22 DIAGNOSIS — Z6826 Body mass index (BMI) 26.0-26.9, adult: Secondary | ICD-10-CM | POA: Diagnosis not present

## 2023-11-12 DIAGNOSIS — Z1231 Encounter for screening mammogram for malignant neoplasm of breast: Secondary | ICD-10-CM | POA: Diagnosis not present

## 2023-11-12 LAB — HM MAMMOGRAPHY

## 2023-11-15 ENCOUNTER — Encounter: Payer: Self-pay | Admitting: Internal Medicine

## 2023-11-18 DIAGNOSIS — R7309 Other abnormal glucose: Secondary | ICD-10-CM | POA: Diagnosis not present

## 2023-11-19 DIAGNOSIS — Z6826 Body mass index (BMI) 26.0-26.9, adult: Secondary | ICD-10-CM | POA: Diagnosis not present

## 2023-11-19 DIAGNOSIS — Z713 Dietary counseling and surveillance: Secondary | ICD-10-CM | POA: Diagnosis not present

## 2023-11-21 ENCOUNTER — Telehealth: Payer: Self-pay

## 2023-11-21 NOTE — Telephone Encounter (Signed)
 Copied from CRM [id number]. 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 Patient stated that she has  discussed the issues with her knees in her previous  visits with provider. Patient stated that letter could be placed on her mychart if provider is okay with providing.

## 2023-11-25 NOTE — Telephone Encounter (Signed)
 What kind of note is she asking for? Is she asking for a note that she cannot do stairs?

## 2023-11-25 NOTE — Telephone Encounter (Signed)
 Yes this is for specifically stairs unless it is extreme emergency like a fire

## 2023-12-18 DIAGNOSIS — R7309 Other abnormal glucose: Secondary | ICD-10-CM | POA: Diagnosis not present

## 2024-01-18 DIAGNOSIS — R7309 Other abnormal glucose: Secondary | ICD-10-CM | POA: Diagnosis not present

## 2024-02-20 DIAGNOSIS — Z713 Dietary counseling and surveillance: Secondary | ICD-10-CM | POA: Diagnosis not present

## 2024-02-20 DIAGNOSIS — Z6826 Body mass index (BMI) 26.0-26.9, adult: Secondary | ICD-10-CM | POA: Diagnosis not present

## 2024-06-22 ENCOUNTER — Ambulatory Visit: Admitting: Internal Medicine

## 2024-09-23 ENCOUNTER — Encounter: Admitting: Internal Medicine
